# Patient Record
Sex: Male | Born: 1980 | Race: White | Hispanic: No | Marital: Married | Smoking: Never smoker
Health system: Southern US, Community
[De-identification: ages and names within clinical notes are randomized; demographics above are authoritative.]

## PROBLEM LIST (undated history)

## (undated) DIAGNOSIS — M549 Dorsalgia, unspecified: Secondary | ICD-10-CM

## (undated) HISTORY — PX: SHOULDER SURGERY: SHX246

---

## 2005-04-25 ENCOUNTER — Emergency Department: Payer: Self-pay | Admitting: Emergency Medicine

## 2006-05-12 ENCOUNTER — Emergency Department: Payer: Self-pay | Admitting: General Practice

## 2008-08-15 ENCOUNTER — Emergency Department: Payer: Self-pay | Admitting: Internal Medicine

## 2008-08-17 ENCOUNTER — Emergency Department: Payer: Self-pay | Admitting: Unknown Physician Specialty

## 2010-11-19 ENCOUNTER — Emergency Department: Payer: Self-pay | Admitting: Emergency Medicine

## 2011-10-24 ENCOUNTER — Emergency Department: Payer: Self-pay | Admitting: Emergency Medicine

## 2011-11-22 ENCOUNTER — Emergency Department: Payer: Self-pay | Admitting: Emergency Medicine

## 2013-04-22 ENCOUNTER — Emergency Department: Payer: Self-pay | Admitting: Emergency Medicine

## 2013-04-23 ENCOUNTER — Emergency Department: Payer: Self-pay | Admitting: Emergency Medicine

## 2014-01-03 ENCOUNTER — Emergency Department: Payer: Self-pay | Admitting: Emergency Medicine

## 2014-02-17 ENCOUNTER — Emergency Department: Payer: Self-pay | Admitting: Emergency Medicine

## 2014-04-09 ENCOUNTER — Emergency Department: Payer: Self-pay | Admitting: Emergency Medicine

## 2014-04-12 ENCOUNTER — Emergency Department: Payer: Self-pay | Admitting: Emergency Medicine

## 2014-04-22 ENCOUNTER — Emergency Department: Payer: Self-pay | Admitting: Emergency Medicine

## 2014-07-12 ENCOUNTER — Emergency Department: Payer: Self-pay | Admitting: Emergency Medicine

## 2015-01-04 ENCOUNTER — Encounter: Payer: Self-pay | Admitting: Intensive Care

## 2015-01-04 ENCOUNTER — Emergency Department
Admission: EM | Admit: 2015-01-04 | Discharge: 2015-01-04 | Disposition: A | Discharge status: Home / Self Care | Payer: Self-pay | Attending: Emergency Medicine | Admitting: Emergency Medicine

## 2015-01-04 DIAGNOSIS — K0889 Other specified disorders of teeth and supporting structures: Secondary | ICD-10-CM

## 2015-01-04 DIAGNOSIS — K088 Other specified disorders of teeth and supporting structures: Secondary | ICD-10-CM | POA: Insufficient documentation

## 2015-01-04 DIAGNOSIS — K029 Dental caries, unspecified: Secondary | ICD-10-CM | POA: Insufficient documentation

## 2015-01-04 MED ORDER — IBUPROFEN 800 MG PO TABS: 800.0000 mg | ORAL_TABLET | Freq: Three times a day (TID) | ORAL | Status: DC | PRN

## 2015-01-04 MED ORDER — PENICILLIN V POTASSIUM 250 MG PO TABS: 250.0000 mg | ORAL_TABLET | Freq: Four times a day (QID) | ORAL | Status: DC

## 2015-01-04 NOTE — ED Provider Notes (Signed)
[facility] Emergency Department Provider Note     Time seen: ----------------------------------------- 5:21 PM on 01/04/2015 -----------------------------------------    I have reviewed the triage vital signs and the nursing notes.   HISTORY  Chief Complaint Dental Pain    HPI Douglas Leonard is a 34 y.o. male who presents ER for left-sided tooth pain. Patient states he had injured the teeth posteriorly on the upper jaw several years ago. States she was brushing last night and seemed to exacerbate tooth pain on that side. He denies any fevers or other complaints.   History reviewed. No pertinent past medical history.  There are no active problems to display for this patient.   History reviewed. No pertinent past surgical history.  Allergies Oxycodone; Percocet; Valium; and Vicodin  Social History Social History  Substance Use Topics  . Smoking status: Never Smoker   . Smokeless tobacco: Never Used  . Alcohol Use: Yes     Comment: Occ    Review of Systems Constitutional: Negative for fever. ENT: Positive for toothache  ____________________________________________   PHYSICAL EXAM:  VITAL SIGNS: ED Triage Vitals  Enc Vitals Group     BP 01/04/15 1707 134/92 mmHg     Pulse Rate 01/04/15 1707 91     Resp 01/04/15 1707 18     Temp 01/04/15 1707 98.7 F (37.1 C)     Temp Source 01/04/15 1707 Oral     SpO2 01/04/15 1707 96 %     Weight 01/04/15 1707 199 lb (90.266 kg)     Height 01/04/15 1707 6' (1.829 m)     Head Cir --      Peak Flow --      Pain Score 01/04/15 1713 4     Pain Loc --      Pain Edu? --      Excl. in GC? --     Constitutional: Alert and oriented. Well appearing and in no distress. ENT   Head: Normocephalic and atraumatic.   Nose: No congestion/rhinnorhea.   Mouth/Throat: Generalized dental caries, no obvious acute dental fractures, no obvious acute dental abscess   Neck: No stridor.  ED  COURSE:  Pertinent labs & imaging results that were available during my care of the patient were reviewed by me and considered in my medical decision making (see chart for details). Patient with dental caries, will be prescribed penicillin and Motrin.  ____________________________________________  FINAL ASSESSMENT AND PLAN  Toothache  Plan: Patient with labs and imaging as dictated above. Patient discharged with Motrin and Pen-Vee K. He is stable for discharge   Emily Filbert, MD   Emily Filbert, MD 01/04/15 [phone]

## 2015-01-04 NOTE — ED Notes (Signed)
Patient c/o dental pain on R and L side

## 2015-01-04 NOTE — Discharge Instructions (Signed)

## 2015-01-05 ENCOUNTER — Encounter: Payer: Self-pay | Admitting: Emergency Medicine

## 2015-01-05 ENCOUNTER — Emergency Department
Admission: EM | Admit: 2015-01-05 | Discharge: 2015-01-05 | Disposition: A | Discharge status: Home / Self Care | Payer: Self-pay | Attending: Emergency Medicine | Admitting: Emergency Medicine

## 2015-01-05 DIAGNOSIS — K029 Dental caries, unspecified: Secondary | ICD-10-CM | POA: Insufficient documentation

## 2015-01-05 DIAGNOSIS — K088 Other specified disorders of teeth and supporting structures: Secondary | ICD-10-CM | POA: Insufficient documentation

## 2015-01-05 DIAGNOSIS — Z792 Long term (current) use of antibiotics: Secondary | ICD-10-CM | POA: Insufficient documentation

## 2015-01-05 DIAGNOSIS — A084 Viral intestinal infection, unspecified: Secondary | ICD-10-CM | POA: Insufficient documentation

## 2015-01-05 MED ORDER — DIPHENOXYLATE-ATROPINE 2.5-0.025 MG PO TABS
2.0000 | ORAL_TABLET | Freq: Once | ORAL | Status: AC
  Administered 2015-01-05: 2 via ORAL
  Filled 2015-01-05: qty 2

## 2015-01-05 MED ORDER — ONDANSETRON 8 MG PO TBDP
8.0000 mg | ORAL_TABLET | Freq: Once | ORAL | Status: AC
  Administered 2015-01-05: 8 mg via ORAL
  Filled 2015-01-05: qty 1

## 2015-01-05 MED ORDER — DIPHENOXYLATE-ATROPINE 2.5-0.025 MG PO TABS: 1.0000 | ORAL_TABLET | Freq: Four times a day (QID) | ORAL | Status: AC | PRN

## 2015-01-05 MED ORDER — ONDANSETRON 8 MG PO TBDP: 8.0000 mg | ORAL_TABLET | Freq: Three times a day (TID) | ORAL | Status: DC | PRN

## 2015-01-05 NOTE — ED Notes (Signed)
Pt was seen for  A toothache yesterday, given Penicillin, pt reports nausea, vomiting and diarrhea since starting medication

## 2015-01-05 NOTE — ED Notes (Signed)
Pt was seen yesterday and dx with dental abcsess, now having allergic reaction to pcn. Pt nauseated.

## 2015-01-05 NOTE — Discharge Instructions (Signed)
Follow up with [facility] today. OPTIONS FOR DENTAL FOLLOW UP CARE  [place] Department of Health and Human Services - Local Safety Net Dental Clinics [email or web address]   [facility] [phone])  Sharl Ma [phone])  [place] [phone])  [facility] [phone])  [facility] [phone]) This clinic caters to the indigent population and is on a lottery system. Location: [employer] [facility], [employer], [address] [place] Clinic Hours: Wednesdays from 6pm - 9pm, patients seen by a lottery system. For dates, call or go to [email or web address] Services: Cleanings, fillings and simple extractions. Payment Options: DENTAL WORK IS FREE OF CHARGE. Bring proof of income or support. Best way to get seen: Arrive at 5:15 pm - this is a lottery, NOT first come/first serve, so arriving earlier will not increase your chances of being seen.     [facility] [phone] Select option 1 for emergencies   Location: [facility], [place], [address] [place] Clinic Hours: No walk-ins accepted - call the day before to schedule an appointment. Check in times are 9:30 am and 1:30 pm. Services: Simple extractions, temporary fillings, pulpectomy/pulp debridement, uncomplicated abscess drainage. Payment Options: PAYMENT IS DUE AT THE TIME OF SERVICE.  Fee is usually $100-200, additional surgical procedures (e.g. abscess drainage) may be extra. Cash, checks, Visa/MasterCard accepted.  Can file Medicaid if patient is covered for dental - patient should call case worker to check. No discount for [facility] patients. Best way to get seen: MUST call the day before and get onto the schedule. Can usually be seen the next 1-2 days. No walk-ins accepted.     [facility]  Dental Services [phone]   Location: [facility], [address] [place] Clinic Hours: M, W, Th, F 8am or 1:30pm, Tues 9a or 1:30 - first come/first served. Services: Simple extractions, temporary fillings, uncomplicated abscess drainage.  You do not need to be an [facility] resident. Payment Options: PAYMENT IS DUE AT THE TIME OF SERVICE. Dental insurance, otherwise sliding scale - bring proof of income or support. Depending on income and treatment needed, cost is usually $50-200. Best way to get seen: Arrive early as it is first come/first served.     [facility] [phone]   Location: [address] Clinic Hours: Mon-Thu 8a-5p Services: Most basic dental services including extractions and fillings. Payment Options: PAYMENT IS DUE AT THE TIME OF SERVICE. Sliding scale, up to 50% off - bring proof if income or support. Medicaid with dental option accepted. Best way to get seen: Call to schedule an appointment, can usually be seen within 2 weeks OR they will try to see walk-ins - show up at 8a or 2p (you may have to wait).     [facility] [phone] [place]IDENTS ONLY   Location: [facility], [address] [place], [place] [address] Clinic Hours: By appointment only. Monday - Thursday 8am-5pm, Friday 8am-12pm Services: Cleanings, fillings, extractions. Payment Options: PAYMENT IS DUE AT THE TIME OF SERVICE. Cash, Visa or MasterCard. Sliding scale - $30 minimum per service. Best way to get seen: Come in to office, complete packet and make an appointment - need proof of income or support monies for each household member and proof of [facility] residence. Usually takes about a month to get in.     [facility]  Location: °[address] [place] °Clinic Hours: Walk-in Urgent Care Dental Services  are offered Monday-Friday mornings only. °The numbers of emergencies accepted daily is limited to the number of °providers available. °Maximum 15 - Mondays, Wednesdays & Thursdays °Maximum 10 - Tuesdays & Fridays °Services: °You do not need to be a [place] resident to be seen for a dental emergency. °Emergencies are defined as pain, swelling, abnormal bleeding, or dental trauma. Walkins will receive x-rays if needed. °NOTE: Dental cleaning is not an emergency. °Payment Options: °PAYMENT IS DUE AT THE TIME OF SERVICE. °Minimum co-pay is $40.00 for uninsured patients. °Minimum co-pay is $3.00 for Medicaid with dental coverage. °Dental Insurance is accepted and must be presented at time of visit. °Medicare does not cover dental. °Forms of payment: Cash, credit card, checks. °Best way to get seen: °If not previously registered with the clinic, walk-in dental registration begins at 7:15 am and is on a first come/first serve basis. °If previously registered with the clinic, call to make an appointment. °  °  °[facility] °[phone] °[place] RESIDENTS ONLY °  °Location: °[address], [place], [place] °Clinic Hours: °Mon-Thu 10a-2p °Services: Extractions only! °Payment Options: °FREE (donations accepted) - bring proof of income or support °Best way to get seen: °Call and schedule an appointment OR come at 8am on the 1st Monday of every month (except for holidays) when it is first come/first served. °  °  °[facility] °[phone] °  °Location: °[address], [place] °Clinic Hours: °Friday mornings °Services, Payment Options, Best way to get seen: °Call for info ° °

## 2015-01-05 NOTE — ED Provider Notes (Signed)
[facility] Emergency Department Provider Note  ____________________________________________  Time seen: Approximately 9:14 AM  I have reviewed the triage vital signs and the nursing notes.   HISTORY  Chief Complaint Allergic Reaction    HPI Yonah Tangeman is a 34 y.o. male patient complain of nausea vomiting diarrhea. Patient stated he started yesterday and started amoxicillin for dental abscess. Patient denies any rash or shortness of breath. Patient said his last vomiting episode was was in the waiting room. Patient said he has some very loose stools is unable to count just had loose stool prior to examination. No palliative measures with this complaint. Last dose of amoxicillin was last night.   History reviewed. No pertinent past medical history.  There are no active problems to display for this patient.   History reviewed. No pertinent past surgical history.  Current Outpatient Rx  Name  Route  Sig  Dispense  Refill  . diphenoxylate-atropine (LOMOTIL) 2.5-0.025 MG per tablet   Oral   Take 1 tablet by mouth 4 (four) times daily as needed for diarrhea or loose stools.   16 tablet   0   . ibuprofen (ADVIL,MOTRIN) 800 MG tablet   Oral   Take 1 tablet (800 mg total) by mouth every 8 (eight) hours as needed.   30 tablet   0   . ondansetron (ZOFRAN ODT) 8 MG disintegrating tablet   Oral   Take 1 tablet (8 mg total) by mouth every 8 (eight) hours as needed for nausea or vomiting.   20 tablet   0   . penicillin v potassium (VEETID) 250 MG tablet   Oral   Take 1 tablet (250 mg total) by mouth 4 (four) times daily.   40 tablet   0     Allergies Oxycodone; Percocet; Valium; and Vicodin  No family history on file.  Social History Social History  Substance Use Topics  . Smoking status: Never Smoker   . Smokeless tobacco: Never Used  . Alcohol Use: Yes     Comment: Occ    Review of Systems Constitutional: No fever/chills Eyes: No  visual changes. ENT: No sore throat. Cardiovascular: Denies chest pain. Respiratory: Denies shortness of breath. Gastrointestinal: No abdominal pain. Nausea vomiting diarrhea  No constipation. Genitourinary: Negative for dysuria. Musculoskeletal: Negative for back pain. Skin: Negative for rash. Neurological: Negative for headaches, focal weakness or numbness. 10-point ROS otherwise negative.  ____________________________________________   PHYSICAL EXAM:  VITAL SIGNS: ED Triage Vitals  Enc Vitals Group     BP 01/05/15 0756 141/86 mmHg     Pulse Rate 01/05/15 0756 79     Resp 01/05/15 0756 17     Temp 01/05/15 0756 98.9 F (37.2 C)     Temp Source 01/05/15 0756 Oral     SpO2 01/05/15 0756 98 %     Weight 01/05/15 0751 200 lb (90.719 kg)     Height 01/05/15 0751 6' (1.829 m)     Head Cir --      Peak Flow --      Pain Score --      Pain Loc --      Pain Edu? --      Excl. in GC? --     Constitutional: Alert and oriented. Well appearing and in no acute distress. Eyes: Conjunctivae are normal. PERRL. EOMI. Head: Atraumatic. Nose: No congestion/rhinnorhea. Mouth/Throat: Mucous membranes are moist.  Oropharynx erythematous deep vitalized tooth number 11 Neck: No stridor.   Cardiovascular: Normal  rate, regular rhythm. Grossly normal heart sounds.  Good peripheral circulation. Respiratory: Normal respiratory effort.  No retractions. Lungs CTAB. Gastrointestinal: Soft and nontender. No distention. Hyperactive bowel sounds No CVA tenderness. Musculoskeletal: No lower extremity tenderness nor edema.  No joint effusions. Neurologic:  Normal speech and language. No gross focal neurologic deficits are appreciated. No gait instability. Skin:  Skin is warm, dry and intact. No rash noted. Psychiatric: Mood and affect are normal. Speech and behavior are normal.  ____________________________________________   LABS (all labs ordered are listed, but only abnormal results are  displayed)  Labs Reviewed - No data to display ____________________________________________  EKG   ____________________________________________  RADIOLOGY   ____________________________________________   PROCEDURES  Procedure(s) performed: None  Critical Care performed: No  ____________________________________________   INITIAL IMPRESSION / ASSESSMENT AND PLAN / ED COURSE  Pertinent labs & imaging results that were available during my care of the patient were reviewed by me and considered in my medical decision making (see chart for details).  Viral gastroenteritis. Dental pain. Advised patient take medication as directed for nausea vomiting diarrhea. Advised patient of the walk-in dental clinic availability today. Patient discharged with prescription for Zofran and Lomotil. OPTIONS FOR DENTAL FOLLOW UP CARE  [place] Department of Health and Human Services - Local Safety Net Dental Clinics [email or web address]   [facility] [phone])  Sharl Ma [phone])  [place] [phone])  [facility] [phone])  [facility] [phone]) This clinic caters to the indigent population and is on a lottery system. Location: [employer] [facility], [employer], [address] [place] Clinic Hours: Wednesdays from 6pm - 9pm, patients seen by a lottery system. For dates, call or go to [email or web address] Services: Cleanings, fillings and simple extractions. Payment Options: DENTAL WORK IS FREE OF CHARGE. Bring proof of income or support. Best way to get seen: Arrive at 5:15 pm - this is a lottery, NOT first come/first serve, so arriving earlier will not increase your chances of being seen.     [facility] [phone] Select option 1 for emergencies   Location: [facility],  [place], [address] [place] Clinic Hours: No walk-ins accepted - call the day before to schedule an appointment. Check in times are 9:30 am and 1:30 pm. Services: Simple extractions, temporary fillings, pulpectomy/pulp debridement, uncomplicated abscess drainage. Payment Options: PAYMENT IS DUE AT THE TIME OF SERVICE.  Fee is usually $100-200, additional surgical procedures (e.g. abscess drainage) may be extra. Cash, checks, Visa/MasterCard accepted.  Can file Medicaid if patient is covered for dental - patient should call case worker to check. No discount for [facility] patients. Best way to get seen: MUST call the day before and get onto the schedule. Can usually be seen the next 1-2 days. No walk-ins accepted.     [facility] [phone]   Location: [facility], [address] [place] Clinic Hours: M, W, Th, F 8am or 1:30pm, Tues 9a or 1:30 - first come/first served. Services: Simple extractions, temporary fillings, uncomplicated abscess drainage.  You do not need to be an [facility] resident. Payment Options: PAYMENT IS DUE AT THE TIME OF SERVICE. Dental insurance, otherwise sliding scale - bring proof of income or support. Depending on income and treatment needed, cost is usually $50-200. Best way to get seen: Arrive early as it is first come/first served.     [facility]  Clinic [phone]   Location: [address] Clinic Hours: Mon-Thu 8a-5p Services: Most basic dental services including extractions and fillings. Payment Options: PAYMENT IS DUE AT THE TIME OF SERVICE. Sliding scale, up to 50% off - bring proof if income or support. Medicaid with dental option accepted. Best way to get seen: Call to schedule an appointment, can usually be seen within 2 weeks OR they will try to see walk-ins - show up at 8a or 2p (you may have to wait).     [facility]  Clinic [phone] [place]IDENTS ONLY   Location: [facility], [address], [place], [place] [address] Clinic Hours: By appointment only. Monday - Thursday 8am-5pm, Friday 8am-12pm Services: Cleanings, fillings, extractions. Payment Options: PAYMENT IS DUE AT THE TIME OF SERVICE. Cash, Visa or MasterCard. Sliding scale - $30 minimum per service. Best way to get seen: Come in to office, complete packet and make an appointment - need proof of income or support monies for each household member and proof of [facility] residence. Usually takes about a month to get in.     [facility] [phone]   Location: [address] [facility] Clinic Hours: Walk-in Urgent Care Dental Services are offered Monday-Friday mornings only. The numbers of emergencies accepted daily is limited to the number of providers available. Maximum 15 - Mondays, Wednesdays & Thursdays Maximum 10 - Tuesdays & Fridays Services: You do not need to be a [facility] resident to be seen for a dental emergency. Emergencies are defined as pain, swelling, abnormal bleeding, or dental trauma. Walkins will receive x-rays if needed. NOTE: Dental cleaning is not an emergency. Payment Options: PAYMENT IS DUE AT THE TIME OF SERVICE. Minimum co-pay is $40.00 for uninsured patients. Minimum co-pay is $3.00 for Medicaid with dental coverage. Dental Insurance is accepted and must be presented at time of visit. Medicare does not cover dental. Forms of payment: Cash, credit card, checks. Best way to get seen: If not previously registered with the clinic, walk-in dental registration begins at 7:15 am and is on a first come/first serve basis. If previously registered with the clinic, call to make an appointment.     [facility] [phone] [place] RESIDENTS ONLY   Location: [address] [place], [place] Clinic Hours: Mon-Thu  10a-2p Services: Extractions only! Payment Options: FREE (donations accepted) - bring proof of income or support Best way to get seen: Call and schedule an appointment OR come at 8am on the 1st Monday of every month (except for holidays) when it is first come/first served.     [facility] [phone]   Location: [address] [place], [place] Clinic Hours: Friday mornings Services, Payment Options, Best way to get seen: Call for info  ____________________________________________   FINAL CLINICAL IMPRESSION(S) / ED DIAGNOSES  Final diagnoses:  Viral gastroenteritis  Pain due to dental caries      Joni Reining, PA-C 01/05/15 0922  Joni Reining, PA-C 01/05/15 0923  Joni Reining, PA-C 01/05/15 3664  Arnaldo Natal, MD 01/05/15 [phone]

## 2015-01-17 ENCOUNTER — Encounter: Payer: Self-pay | Admitting: Emergency Medicine

## 2015-01-17 ENCOUNTER — Emergency Department
Admission: EM | Admit: 2015-01-17 | Discharge: 2015-01-17 | Disposition: A | Discharge status: Home / Self Care | Payer: Self-pay | Attending: Emergency Medicine | Admitting: Emergency Medicine

## 2015-01-17 DIAGNOSIS — Z792 Long term (current) use of antibiotics: Secondary | ICD-10-CM | POA: Insufficient documentation

## 2015-01-17 DIAGNOSIS — Y9289 Other specified places as the place of occurrence of the external cause: Secondary | ICD-10-CM | POA: Insufficient documentation

## 2015-01-17 DIAGNOSIS — IMO0002 Reserved for concepts with insufficient information to code with codable children: Secondary | ICD-10-CM

## 2015-01-17 DIAGNOSIS — Y998 Other external cause status: Secondary | ICD-10-CM | POA: Insufficient documentation

## 2015-01-17 DIAGNOSIS — S61411A Laceration without foreign body of right hand, initial encounter: Secondary | ICD-10-CM | POA: Insufficient documentation

## 2015-01-17 DIAGNOSIS — Y288XXA Contact with other sharp object, undetermined intent, initial encounter: Secondary | ICD-10-CM | POA: Insufficient documentation

## 2015-01-17 DIAGNOSIS — Y9389 Activity, other specified: Secondary | ICD-10-CM | POA: Insufficient documentation

## 2015-01-17 DIAGNOSIS — Z23 Encounter for immunization: Secondary | ICD-10-CM | POA: Insufficient documentation

## 2015-01-17 MED ORDER — TETANUS-DIPHTH-ACELL PERTUSSIS 5-2.5-18.5 LF-MCG/0.5 IM SUSP
0.5000 mL | Freq: Once | INTRAMUSCULAR | Status: AC
  Administered 2015-01-17: 0.5 mL via INTRAMUSCULAR
  Filled 2015-01-17: qty 0.5

## 2015-01-17 MED ORDER — LIDOCAINE HCL (PF) 1 % IJ SOLN
5.0000 mL | Freq: Once | INTRAMUSCULAR | Status: AC
Start: 2015-01-17 — End: 2015-01-17
  Administered 2015-01-17: 5 mL via INTRADERMAL
  Filled 2015-01-17: qty 5

## 2015-01-17 NOTE — ED Notes (Signed)
Laceration to right hand while changing tire. Bleeding controled at this time.

## 2015-01-17 NOTE — ED Notes (Signed)
Laceration to right hand °

## 2015-01-17 NOTE — ED Provider Notes (Signed)
[facility] Emergency Department Provider Note ____________________________________________  Time seen: Approximately 11:01 AM  I have reviewed the triage vital signs and the nursing notes.   HISTORY  Chief Complaint Laceration  HPI Douglas Leonard is a 34 y.o. male who presents to the emergency department for evaluation of a laceration to his right hand. He was changing a tire and cut it on the rim. He is unsure of the last time he had a Tdap.   History reviewed. No pertinent past medical history.  There are no active problems to display for this patient.   History reviewed. No pertinent past surgical history.  Current Outpatient Rx  Name  Route  Sig  Dispense  Refill  . diphenoxylate-atropine (LOMOTIL) 2.5-0.025 MG per tablet   Oral   Take 1 tablet by mouth 4 (four) times daily as needed for diarrhea or loose stools.   16 tablet   0   . ibuprofen (ADVIL,MOTRIN) 800 MG tablet   Oral   Take 1 tablet (800 mg total) by mouth every 8 (eight) hours as needed.   30 tablet   0   . ondansetron (ZOFRAN ODT) 8 MG disintegrating tablet   Oral   Take 1 tablet (8 mg total) by mouth every 8 (eight) hours as needed for nausea or vomiting.   20 tablet   0   . penicillin v potassium (VEETID) 250 MG tablet   Oral   Take 1 tablet (250 mg total) by mouth 4 (four) times daily.   40 tablet   0     Allergies Oxycodone; Percocet; Valium; and Vicodin  No family history on file.  Social History Social History  Substance Use Topics  . Smoking status: Never Smoker   . Smokeless tobacco: Never Used  . Alcohol Use: Yes     Comment: Occ    Review of Systems   Constitutional: No fever/chills Eyes: No visual changes. ENT: No congestion or rhinorrhea Cardiovascular: Denies chest pain. Respiratory: Denies shortness of breath. Gastrointestinal: No abdominal pain.  No nausea, no vomiting.  No diarrhea.  No constipation. Genitourinary: Negative for  dysuria. Musculoskeletal: Negative for back pain. Skin: Positive for laceration Neurological: Negative for headaches, focal weakness or numbness.  10-point ROS otherwise negative.  ____________________________________________   PHYSICAL EXAM:  VITAL SIGNS: ED Triage Vitals  Enc Vitals Group     BP 01/17/15 1009 140/80 mmHg     Pulse Rate 01/17/15 1009 80     Resp 01/17/15 1009 20     Temp 01/17/15 1009 98 F (36.7 C)     Temp Source 01/17/15 1009 Oral     SpO2 01/17/15 1009 98 %     Weight 01/17/15 1009 195 lb (88.451 kg)     Height 01/17/15 1009 6' (1.829 m)     Head Cir --      Peak Flow --      Pain Score 01/17/15 1010 0     Pain Loc --      Pain Edu? --      Excl. in GC? --     Constitutional: Alert and oriented. Well appearing and in no acute distress. Eyes: Conjunctivae are normal. PERRL. EOMI. Head: Atraumatic. Nose: No congestion/rhinnorhea. Mouth/Throat: Mucous membranes are moist.  Oropharynx non-erythematous. No oral lesions. Neck: No stridor. Cardiovascular: Normal rate, regular rhythm.  Good peripheral circulation. Respiratory: Normal respiratory effort.  No retractions. Lungs CTAB. Gastrointestinal: Soft and nontender. No distention. No abdominal bruits.  Musculoskeletal: No lower extremity  tenderness nor edema.  No joint effusions. Full range of motion of fingers and wrist on the right hand Neurologic:  Normal speech and language. No gross focal neurologic deficits are appreciated. Speech is normal. No gait instability. Skin:  1.5 cm laceration noted to the dorsal aspect of the right hand just above the webspace between the third and fourth digits; Negative for petechiae.  Psychiatric: Mood and affect are normal. Speech and behavior are normal.  ____________________________________________   LABS (all labs ordered are listed, but only abnormal results are displayed)  Labs Reviewed - No data to  display ____________________________________________  EKG   ____________________________________________  RADIOLOGY  Not indicated ____________________________________________   PROCEDURES  Procedure(s) performed: None ____________________________________________   INITIAL IMPRESSION / ASSESSMENT AND PLAN / ED COURSE  Pertinent labs & imaging results that were available during my care of the patient were reviewed by me and considered in my medical decision making (see chart for details).  Patient was advised to return in 10 days for suture removal. He was advised to return sooner for symptoms of concern. ____________________________________________   FINAL CLINICAL IMPRESSION(S) / ED DIAGNOSES  Final diagnoses:  Laceration       Chinita Pester, FNP 01/17/15 1343  Sharman Cheek, MD 01/17/15 [phone]

## 2016-02-18 ENCOUNTER — Encounter: Payer: Self-pay | Admitting: Urgent Care

## 2016-02-18 ENCOUNTER — Emergency Department
Admission: EM | Admit: 2016-02-18 | Discharge: 2016-02-18 | Disposition: A | Discharge status: Home / Self Care | Payer: Self-pay | Attending: Emergency Medicine | Admitting: Emergency Medicine

## 2016-02-18 DIAGNOSIS — H6691 Otitis media, unspecified, right ear: Secondary | ICD-10-CM | POA: Insufficient documentation

## 2016-02-18 DIAGNOSIS — H6690 Otitis media, unspecified, unspecified ear: Secondary | ICD-10-CM

## 2016-02-18 DIAGNOSIS — Z79899 Other long term (current) drug therapy: Secondary | ICD-10-CM | POA: Insufficient documentation

## 2016-02-18 DIAGNOSIS — H60501 Unspecified acute noninfective otitis externa, right ear: Secondary | ICD-10-CM | POA: Insufficient documentation

## 2016-02-18 MED ORDER — AMOXICILLIN-POT CLAVULANATE 875-125 MG PO TABS: 1.0000 | ORAL_TABLET | Freq: Two times a day (BID) | ORAL | 0 refills | Status: AC

## 2016-02-18 MED ORDER — CIPROFLOXACIN-DEXAMETHASONE 0.3-0.1 % OT SUSP
4.0000 [drp] | Freq: Once | OTIC | Status: AC
  Administered 2016-02-18: 4 [drp] via OTIC
  Filled 2016-02-18: qty 7.5

## 2016-02-18 MED ORDER — AMOXICILLIN-POT CLAVULANATE 875-125 MG PO TABS
1.0000 | ORAL_TABLET | Freq: Once | ORAL | Status: AC
  Administered 2016-02-18: 1 via ORAL
  Filled 2016-02-18: qty 1

## 2016-02-18 MED ORDER — CIPROFLOXACIN-DEXAMETHASONE 0.3-0.1 % OT SUSP: 4.0000 [drp] | Freq: Two times a day (BID) | OTIC | 0 refills | Status: AC

## 2016-02-18 NOTE — ED Provider Notes (Signed)
[facility] Emergency Department Provider Note    First MD Initiated Contact with Patient 02/18/16 0430     (approximate)  I have reviewed the triage vital signs and the nursing notes.   HISTORY  Chief Complaint Ear Fullness  HPI Shaune SpittleMichael Rye is a 35 y.o. male presents with acute onset of right ear pain and drainage 1 day. Patient states that he has decreased hearing from that ear stating that is not completely deaf however sounds are muffled as if he were "under water". Patient also states that he hears a swishing sound in his ear. Patient denies any fever.   Past medical history None There are no active problems to display for this patient.   Past surgical history None  Prior to Admission medications   Medication Sig Start Date End Date Taking? Authorizing Provider  ibuprofen (ADVIL,MOTRIN) 800 MG tablet Take 1 tablet (800 mg total) by mouth every 8 (eight) hours as needed. 01/04/15   Emily FilbertJonathan E Williams, MD  ondansetron (ZOFRAN ODT) 8 MG disintegrating tablet Take 1 tablet (8 mg total) by mouth every 8 (eight) hours as needed for nausea or vomiting. 01/05/15   Joni Reiningonald K Smith, PA-C  penicillin v potassium (VEETID) 250 MG tablet Take 1 tablet (250 mg total) by mouth 4 (four) times daily. 01/04/15   Emily FilbertJonathan E Williams, MD    Allergies Oxycodone; Percocet [oxycodone-acetaminophen]; Valium [diazepam]; and Vicodin [hydrocodone-acetaminophen]  No family history on file.  Social History Social History  Substance Use Topics  . Smoking status: Never Smoker  . Smokeless tobacco: Never Used  . Alcohol use No     Comment: Occ    Review of Systems Constitutional: No fever/chills Eyes: No visual changes. ENT: No sore throat.Positive for right ear pain Cardiovascular: Denies chest pain. Respiratory: Denies shortness of breath. Gastrointestinal: No abdominal pain.  No nausea, no vomiting.  No diarrhea.  No constipation. Genitourinary: Negative for  dysuria. Musculoskeletal: Negative for back pain. Skin: Negative for rash. Neurological: Negative for headaches, focal weakness or numbness.  10-point ROS otherwise negative.  ____________________________________________   PHYSICAL EXAM:  VITAL SIGNS: ED Triage Vitals [02/18/16 0225]  Enc Vitals Group     BP 132/87     Pulse Rate 84     Resp 16     Temp 98.8 F (37.1 C)     Temp Source Oral     SpO2 98 %     Weight 205 lb (93 kg)     Height 6' (1.829 m)     Head Circumference      Peak Flow      Pain Score 0     Pain Loc      Pain Edu?      Excl. in GC?     Constitutional: Alert and oriented. Well appearing and in no acute distress. Eyes: Conjunctivae are normal. PERRL. EOMI. Head: Atraumatic. Ears:  Purulent-appearing right EAC exudate noted Nose: No congestion/rhinnorhea. Mouth/Throat: Mucous membranes are moist.  Oropharynx non-erythematous. Neck: Right anterior cervical lymphadenopathy noted  Cardiovascular: Normal rate, regular rhythm. Good peripheral circulation. Grossly normal heart sounds. Respiratory: Normal respiratory effort.  No retractions. Lungs CTAB. Gastrointestinal: Soft and nontender. No distention.  Musculoskeletal: No lower extremity tenderness nor edema. No gross deformities of extremities. Neurologic:  Normal speech and language. No gross focal neurologic deficits are appreciated.  Skin:  Skin is warm, dry and intact. No rash noted. Psychiatric: Mood and affect are normal. Speech and behavior are normal.  Procedures    INITIAL IMPRESSION / ASSESSMENT AND PLAN / ED COURSE  Pertinent labs & imaging results that were available during my care of the patient were reviewed by me and considered in my medical decision making (see chart for details).  Ciprodex given, Augmentin given  Clinical Course    ____________________________________________  FINAL CLINICAL IMPRESSION(S) / ED DIAGNOSES  Final diagnoses:  Acute otitis externa of  right ear, unspecified type  Acute otitis media, unspecified otitis media type     MEDICATIONS GIVEN DURING THIS VISIT:  Medications  ciprofloxacin-dexamethasone (CIPRODEX) 0.3-0.1 % otic suspension 4 drop (not administered)  amoxicillin-clavulanate (AUGMENTIN) 875-125 MG per tablet 1 tablet (not administered)     NEW OUTPATIENT MEDICATIONS STARTED DURING THIS VISIT:  New Prescriptions   No medications on file    Modified Medications   No medications on file    Discontinued Medications   No medications on file     Note:  This document was prepared using Dragon voice recognition software and may include unintentional dictation errors.    Darci Current, MD 02/20/16 [phone]

## 2016-02-18 NOTE — ED Triage Notes (Signed)
Patient presents to the ED tonight with c/o decreased ability to hear out of his RIGHT ear. Symptoms started yesterday. Patient states, "I am not completely deaf, but it sounds like there is water in there and when I move it, I can hear it squishing". Patient denies recent illnesses, otorrhea, and otalgia. NOS reported at this time.

## 2016-08-15 ENCOUNTER — Encounter: Payer: Self-pay | Admitting: Emergency Medicine

## 2016-08-15 ENCOUNTER — Emergency Department
Admission: EM | Admit: 2016-08-15 | Discharge: 2016-08-15 | Disposition: A | Discharge status: Home / Self Care | Payer: 59 | Attending: Emergency Medicine | Admitting: Emergency Medicine

## 2016-08-15 DIAGNOSIS — J019 Acute sinusitis, unspecified: Secondary | ICD-10-CM

## 2016-08-15 MED ORDER — IBUPROFEN 600 MG PO TABS
600.0000 mg | ORAL_TABLET | Freq: Once | ORAL | Status: AC
  Administered 2016-08-15: 600 mg via ORAL
  Filled 2016-08-15: qty 1

## 2016-08-15 MED ORDER — FLUTICASONE PROPIONATE 50 MCG/ACT NA SUSP: 1.0000 | Freq: Every day | NASAL | 0 refills | Status: DC

## 2016-08-15 MED ORDER — PSEUDOEPHEDRINE HCL 30 MG PO TABS: 30.0000 mg | ORAL_TABLET | Freq: Four times a day (QID) | ORAL | 0 refills | Status: AC | PRN

## 2016-08-15 NOTE — ED Provider Notes (Signed)
[facility] Emergency Department Provider Note   ____________________________________________   First MD Initiated Contact with Patient 08/15/16 [phone]     (approximate)  I have reviewed the triage vital signs and the nursing notes.   HISTORY  Chief Complaint Facial Pain; Nasal Congestion; and Cough    HPI Douglas Leonard is a 36 y.o. male who comes into the hospital today with some sinus problems. He reports he can't get his sinuses to say clear. The patient reports that he has been taking oxymetolazone and it has not helped.The patient reports that he took it before bedtime and woke up with phlegm and severe congestion. He is also been taking Mucinex sinus. The patient's symptoms started on Saturday morning approximately 3 days ago. He reports that he thought it was due to the weather but he did have some fevers and chills at night. The patient states his MAXIMUM TEMPERATURE was 101. The patient has had worsening symptoms and some mild improvement. He reports that he's been unable to sleep for 3 nights because he cannot breathe very well. The patient reports that he had a cough with some yellow phlegm and white streaks which concerned him. He is here today for evaluation.   History reviewed. No pertinent past medical history.  There are no active problems to display for this patient.   History reviewed. No pertinent surgical history.  Prior to Admission medications   Medication Sig Start Date End Date Taking? Authorizing Provider  fluticasone (FLONASE) 50 MCG/ACT nasal spray Place 1 spray into both nostrils daily. 08/15/16 08/15/17  Rebecka Apley, MD  ibuprofen (ADVIL,MOTRIN) 800 MG tablet Take 1 tablet (800 mg total) by mouth every 8 (eight) hours as needed. 01/04/15   Emily Filbert, MD  ondansetron (ZOFRAN ODT) 8 MG disintegrating tablet Take 1 tablet (8 mg total) by mouth every 8 (eight) hours as needed for nausea or vomiting. 01/05/15   Joni Reining, PA-C  penicillin v potassium (VEETID) 250 MG tablet Take 1 tablet (250 mg total) by mouth 4 (four) times daily. 01/04/15   Emily Filbert, MD  pseudoephedrine (SUDAFED) 30 MG tablet Take 1 tablet (30 mg total) by mouth every 6 (six) hours as needed for congestion. 08/15/16 08/15/17  Rebecka Apley, MD    Allergies Oxycodone; Percocet [oxycodone-acetaminophen]; Valium [diazepam]; and Vicodin [hydrocodone-acetaminophen]  History reviewed. No pertinent family history.  Social History Social History  Substance Use Topics  . Smoking status: Never Smoker  . Smokeless tobacco: Never Used  . Alcohol use No     Comment: Occ    Review of Systems  Constitutional: No fever/chills Eyes: No visual changes. ENT: Facial tenderness to palpation and nasal congestion Cardiovascular: Denies chest pain. Respiratory: Cough and shortness of breath. Gastrointestinal: No abdominal pain.  No nausea, no vomiting.  No diarrhea.  No constipation. Genitourinary: Negative for dysuria. Musculoskeletal: Negative for back pain. Skin: Negative for rash. Neurological: Negative for headaches, focal weakness or numbness.   ____________________________________________   PHYSICAL EXAM:  VITAL SIGNS: ED Triage Vitals  Enc Vitals Group     BP 08/15/16 0434 124/84     Pulse Rate 08/15/16 0434 99     Resp 08/15/16 0434 16     Temp 08/15/16 0434 99.5 F (37.5 C)     Temp Source 08/15/16 0434 Oral     SpO2 08/15/16 0434 99 %     Weight 08/15/16 0432 205 lb (93 kg)     Height --  Head Circumference --      Peak Flow --      Pain Score --      Pain Loc --      Pain Edu? --      Excl. in GC? --     Constitutional: Alert and oriented. Well appearing and in mild distress. Eyes: Conjunctivae are normal. PERRL. EOMI. Head: Atraumatic. Nose: No congestion/rhinnorhea. Maxillary and sphenoid sinus tenderness to palpation Mouth/Throat: Mucous membranes are moist.  Oropharynx  non-erythematous. Cardiovascular: Normal rate, regular rhythm. Grossly normal heart sounds.  Good peripheral circulation. Respiratory: Normal respiratory effort.  No retractions. Lungs CTAB. Gastrointestinal: Soft and nontender. No distention. Musculoskeletal: No lower extremity tenderness nor edema.  Neurologic:  Normal speech and language.  Skin:  Skin is warm, dry and intact Psychiatric: Mood and affect are normal.   ____________________________________________   LABS (all labs ordered are listed, but only abnormal results are displayed)  Labs Reviewed - No data to display ____________________________________________  EKG  none ____________________________________________  RADIOLOGY  none ____________________________________________   PROCEDURES  Procedure(s) performed: None  Procedures  Critical Care performed: No  ____________________________________________   INITIAL IMPRESSION / ASSESSMENT AND PLAN / ED COURSE  Pertinent labs & imaging results that were available during my care of the patient were reviewed by me and considered in my medical decision making (see chart for details).  This is a 36 year old male who comes into the hospital today with some sinus pain and pressure as well as some nasal congestion and cough. The patient reports that he is concerned but I think he may have some sinusitis. The patient is only had symptoms for 3 days. Typical sinusitis will clear on its own within about 10 days. I will treat the patient with some ibuprofen currently as well as some Flonase and Sudafed. I will discharge the patient to have him follow-up with the acute care clinic. The patient has no further complaints or concerns at this time. I will not give the patient amoxicillin at this time.      ____________________________________________   FINAL CLINICAL IMPRESSION(S) / ED DIAGNOSES  Final diagnoses:  Acute non-recurrent sinusitis, unspecified location       NEW MEDICATIONS STARTED DURING THIS VISIT:  Discharge Medication List as of 08/15/2016  5:56 AM    START taking these medications   Details  fluticasone (FLONASE) 50 MCG/ACT nasal spray Place 1 spray into both nostrils daily., Starting Tue 08/15/2016, Until Wed 08/15/2017, Print    pseudoephedrine (SUDAFED) 30 MG tablet Take 1 tablet (30 mg total) by mouth every 6 (six) hours as needed for congestion., Starting Tue 08/15/2016, Until Wed 08/15/2017, Print         Note:  This document was prepared using Dragon voice recognition software and may include unintentional dictation errors.    Rebecka Apley, MD 08/15/16 [phone]

## 2016-08-15 NOTE — ED Triage Notes (Addendum)
Pt ambulatory to triage with steady gait, no distress noted. Pt c/o of sinus pressure, nasal congestion, cough x3 days, unrelieved by musinex and OTC nasal spray. Pts O2 in triage at 99% RA, pt A&O x4. Pt denies SOB.

## 2016-08-15 NOTE — ED Notes (Signed)

## 2016-08-15 NOTE — ED Notes (Signed)
Pt c/o nasal congestion that woke him up at around 0300; pt states using a nasal decongestant around 2300 yesterday and that worked to relieve congestion but it "stopped working at 3"; at this time pt is on the stretcher, on his phone; pt does not appear to be in distress at this time; pt's VS are WDL; pt is alert and oriented x4 and able to speak in complete sentences.

## 2016-09-26 ENCOUNTER — Encounter: Payer: Self-pay | Admitting: Emergency Medicine

## 2016-09-26 ENCOUNTER — Emergency Department
Admission: EM | Admit: 2016-09-26 | Discharge: 2016-09-26 | Disposition: A | Discharge status: Home / Self Care | Payer: 59 | Attending: Emergency Medicine | Admitting: Emergency Medicine

## 2016-09-26 DIAGNOSIS — H6991 Unspecified Eustachian tube disorder, right ear: Secondary | ICD-10-CM

## 2016-09-26 DIAGNOSIS — J0110 Acute frontal sinusitis, unspecified: Secondary | ICD-10-CM | POA: Insufficient documentation

## 2016-09-26 DIAGNOSIS — H6121 Impacted cerumen, right ear: Secondary | ICD-10-CM | POA: Insufficient documentation

## 2016-09-26 DIAGNOSIS — H6981 Other specified disorders of Eustachian tube, right ear: Secondary | ICD-10-CM | POA: Insufficient documentation

## 2016-09-26 MED ORDER — PREDNISONE 10 MG (21) PO TBPK: ORAL_TABLET | Freq: Every day | ORAL | 0 refills | Status: AC

## 2016-09-26 MED ORDER — AMOXICILLIN-POT CLAVULANATE 875-125 MG PO TABS: 1.0000 | ORAL_TABLET | Freq: Two times a day (BID) | ORAL | 0 refills | Status: AC

## 2016-09-26 NOTE — ED Triage Notes (Signed)
States he has had problems with sinus pressure and ear fullness for several weeks  The sx's became better and now has right ear fullness  This started about 1 1/2weeks ago

## 2016-09-26 NOTE — ED Provider Notes (Signed)
[facility] Emergency Department Provider Note  ____________________________________________  Time seen: Approximately 8:13 PM  I have reviewed the triage vital signs and the nursing notes.   HISTORY  Chief Complaint Otalgia    HPI Douglas Leonard is a 36 y.o. male presenting to the emergency department with right otalgia, diminished hearing on the right, and maxillary sinus tenderness for approximately 2 weeks. Patient states that he presented to his primary care provider, who recommended Flonase. Patient has been afebrile. He has noticed no discharge from the right ear. He denies a history of middle ear infections. He has been afebrile. He denies associated chest pain, chest tightness, nausea, vomiting or abdominal pain. No other alleviating measures have been undertaken   History reviewed. No pertinent past medical history.  There are no active problems to display for this patient.   History reviewed. No pertinent surgical history.  Prior to Admission medications   Medication Sig Start Date End Date Taking? Authorizing Provider  amoxicillin-clavulanate (AUGMENTIN) 875-125 MG tablet Take 1 tablet by mouth 2 (two) times daily. 09/26/16 10/06/16  Orvil FeilWoods, Wynne Jury M, PA-C  fluticasone (FLONASE) 50 MCG/ACT nasal spray Place 1 spray into both nostrils daily. 08/15/16 08/15/17  Rebecka ApleyWebster, Allison P, MD  ibuprofen (ADVIL,MOTRIN) 800 MG tablet Take 1 tablet (800 mg total) by mouth every 8 (eight) hours as needed. 01/04/15   Emily FilbertWilliams, Jonathan E, MD  ondansetron (ZOFRAN ODT) 8 MG disintegrating tablet Take 1 tablet (8 mg total) by mouth every 8 (eight) hours as needed for nausea or vomiting. 01/05/15   Joni ReiningSmith, Ronald K, PA-C  penicillin v potassium (VEETID) 250 MG tablet Take 1 tablet (250 mg total) by mouth 4 (four) times daily. 01/04/15   Emily FilbertWilliams, Jonathan E, MD  predniSONE (STERAPRED UNI-PAK 21 TAB) 10 MG (21) TBPK tablet Take by mouth daily. Take 6 tablets the first day, take 5  tablets the second day, take 4 tablets the third day, take 3 tablets the fourth day, take 2 tablets the fifth day, take 1 tablet the sixth day. 09/26/16 10/02/16  Orvil FeilWoods, Tyger Oka M, PA-C  pseudoephedrine (SUDAFED) 30 MG tablet Take 1 tablet (30 mg total) by mouth every 6 (six) hours as needed for congestion. 08/15/16 08/15/17  Rebecka ApleyWebster, Allison P, MD    Allergies Oxycodone; Percocet [oxycodone-acetaminophen]; Valium [diazepam]; and Vicodin [hydrocodone-acetaminophen]  No family history on file.  Social History Social History  Substance Use Topics  . Smoking status: Never Smoker  . Smokeless tobacco: Never Used  . Alcohol use No     Comment: Occ     Review of Systems  Constitutional:Patient has maxillary sinus tenderness. Eyes: No visual changes. No discharge ENT: Patient has diminished hearing on the right. Cardiovascular: no chest pain. Respiratory: no cough. No SOB. Musculoskeletal: Negative for musculoskeletal pain. Skin: Negative for rash, abrasions, lacerations, ecchymosis. Neurological: Negative for headaches, focal weakness or numbness.   ____________________________________________   PHYSICAL EXAM:  VITAL SIGNS: ED Triage Vitals  Enc Vitals Group     BP 09/26/16 1804 121/73     Pulse Rate 09/26/16 1804 80     Resp 09/26/16 1804 18     Temp 09/26/16 1804 97.9 F (36.6 C)     Temp Source 09/26/16 1804 Oral     SpO2 09/26/16 1804 99 %     Weight 09/26/16 1814 205 lb (93 kg)     Height 09/26/16 1814 6' (1.829 m)     Head Circumference --      Peak Flow --  Pain Score 09/26/16 1927 0     Pain Loc --      Pain Edu? --      Excl. in GC? --      Constitutional: Alert and oriented. Well appearing and in no acute distress. Eyes: Conjunctivae are normal. PERRL. EOMI. Head: Atraumatic.Patient has maxillary sinus tenderness. ENT:      Ears: Patient's right tympanic membrane cannot be entirely visualized due to impacted cerumen. Left tympanic membrane appears  scarred.      Nose: No congestion/rhinnorhea.      Mouth/Throat: Mucous membranes are moist.  Neck: Full range of motion Hematological/Lymphatic/Immunilogical: No cervical lymphadenopathy. Cardiovascular: Normal rate, regular rhythm. Normal S1 and S2.  Good peripheral circulation. Respiratory: Normal respiratory effort without tachypnea or retractions. Lungs CTAB. Good air entry to the bases with no decreased or absent breath sounds. Skin:  Skin is warm, dry and intact. No rash noted. Psychiatric: Mood and affect are normal. Speech and behavior are normal. Patient exhibits appropriate insight and judgement.   ____________________________________________   LABS (all labs ordered are listed, but only abnormal results are displayed)  Labs Reviewed - No data to display ____________________________________________  EKG   ____________________________________________  RADIOLOGY   No results found.  ____________________________________________    PROCEDURES  Procedure(s) performed:    Procedures    Medications - No data to display   ____________________________________________   INITIAL IMPRESSION / ASSESSMENT AND PLAN / ED COURSE  Pertinent labs & imaging results that were available during my care of the patient were reviewed by me and considered in my medical decision making (see chart for details).  Review of the [place] CSRS was performed in accordance of the [facility] prior to dispensing any controlled drugs.     Assessment and plan Eustachian tube dysfunction Sinusitis Patient presents to the emergency department with diminished hearing and maxillary sinus tenderness for the past 2 weeks. History and physical exam findings are consistent with sinusitis and eustachian tube dysfunction. Patient was discharged with Augmentin and tapered prednisone. Patient was advised to follow-up with primary care in 1 week. Vital signs are reassuring prior to discharge. All patient  questions were answered.    ____________________________________________  FINAL CLINICAL IMPRESSION(S) / ED DIAGNOSES  Final diagnoses:  Acute non-recurrent frontal sinusitis  Dysfunction of right eustachian tube      NEW MEDICATIONS STARTED DURING THIS VISIT:  Discharge Medication List as of 09/26/2016  7:24 PM    START taking these medications   Details  amoxicillin-clavulanate (AUGMENTIN) 875-125 MG tablet Take 1 tablet by mouth 2 (two) times daily., Starting Tue 09/26/2016, Until Fri 10/06/2016, Print    predniSONE (STERAPRED UNI-PAK 21 TAB) 10 MG (21) TBPK tablet Take by mouth daily. Take 6 tablets the first day, take 5 tablets the second day, take 4 tablets the third day, take 3 tablets the fourth day, take 2 tablets the fifth day, take 1 tablet the sixth day., Starting Tue 09/26/2016, Until Mon 10/02/2016, Print            This chart was dictated using voice recognition software/Dragon. Despite best efforts to proofread, errors can occur which can change the meaning. Any change was purely unintentional.    Orvil Feil, PA-C 09/26/16 2026    Myrna Blazer, MD 09/26/16 2329

## 2016-10-03 ENCOUNTER — Encounter: Payer: Self-pay | Admitting: *Deleted

## 2016-10-03 ENCOUNTER — Emergency Department
Admission: EM | Admit: 2016-10-03 | Discharge: 2016-10-03 | Disposition: A | Discharge status: Home / Self Care | Payer: Self-pay | Attending: Emergency Medicine | Admitting: Emergency Medicine

## 2016-10-03 DIAGNOSIS — H9209 Otalgia, unspecified ear: Secondary | ICD-10-CM | POA: Insufficient documentation

## 2016-10-03 DIAGNOSIS — Z5321 Procedure and treatment not carried out due to patient leaving prior to being seen by health care provider: Secondary | ICD-10-CM | POA: Insufficient documentation

## 2016-10-03 NOTE — ED Triage Notes (Signed)
Pt has right ear pain.   Pt was seen here last week with similar sx and states i'm not any better.

## 2016-10-03 NOTE — ED Notes (Signed)
No answer when called from lobby 

## 2016-10-04 ENCOUNTER — Emergency Department
Admission: EM | Admit: 2016-10-04 | Discharge: 2016-10-04 | Disposition: A | Discharge status: Home / Self Care | Payer: Self-pay | Attending: Emergency Medicine | Admitting: Emergency Medicine

## 2016-10-04 DIAGNOSIS — Z79899 Other long term (current) drug therapy: Secondary | ICD-10-CM | POA: Insufficient documentation

## 2016-10-04 DIAGNOSIS — H6121 Impacted cerumen, right ear: Secondary | ICD-10-CM | POA: Insufficient documentation

## 2016-10-04 NOTE — ED Triage Notes (Signed)
Pt reports FB in right ear x2 weeks.

## 2016-10-04 NOTE — ED Notes (Signed)
See triage note  States he feels like there is something in right ear  Hx of same ..recently treated for sinusitis  Also tried OTC ear flushes w/o relief

## 2016-10-04 NOTE — ED Provider Notes (Signed)
[facility] Emergency Department Provider Note   ____________________________________________   First MD Initiated Contact with Patient 10/04/16 1040     (approximate)  I have reviewed the triage vital signs and the nursing notes.   HISTORY  Chief Complaint Foreign Body in Ear    HPI Shaune SpittleMichael Metzger is a 36 y.o. male patient complaining of foreign body right ear for 2 weeks. Patient was seen last week and diagnosed with otitis media and placed on amoxicillin. Patient has been using over-the-counter ear drops to loosen the suspected wax in his ear. Patient states still unable to dislodge foreign body. Patient states continue hearing loss secondary to wax buildup. Patient denies pain with this complaint.   No past medical history on file.  There are no active problems to display for this patient.   No past surgical history on file.  Prior to Admission medications   Medication Sig Start Date End Date Taking? Authorizing Provider  amoxicillin-clavulanate (AUGMENTIN) 875-125 MG tablet Take 1 tablet by mouth 2 (two) times daily. 09/26/16 10/06/16  Orvil FeilWoods, Jaclyn M, PA-C  fluticasone (FLONASE) 50 MCG/ACT nasal spray Place 1 spray into both nostrils daily. 08/15/16 08/15/17  Rebecka ApleyWebster, Allison P, MD  ibuprofen (ADVIL,MOTRIN) 800 MG tablet Take 1 tablet (800 mg total) by mouth every 8 (eight) hours as needed. 01/04/15   Emily FilbertWilliams, Jonathan E, MD  ondansetron (ZOFRAN ODT) 8 MG disintegrating tablet Take 1 tablet (8 mg total) by mouth every 8 (eight) hours as needed for nausea or vomiting. 01/05/15   Joni ReiningSmith, Akayla Brass K, PA-C  penicillin v potassium (VEETID) 250 MG tablet Take 1 tablet (250 mg total) by mouth 4 (four) times daily. 01/04/15   Emily FilbertWilliams, Jonathan E, MD  pseudoephedrine (SUDAFED) 30 MG tablet Take 1 tablet (30 mg total) by mouth every 6 (six) hours as needed for congestion. 08/15/16 08/15/17  Rebecka ApleyWebster, Allison P, MD    Allergies Oxycodone; Percocet  [oxycodone-acetaminophen]; Valium [diazepam]; and Vicodin [hydrocodone-acetaminophen]  No family history on file.  Social History Social History  Substance Use Topics  . Smoking status: Never Smoker  . Smokeless tobacco: Never Used  . Alcohol use No     Comment: Occ    Review of Systems  Constitutional: No fever/chills Eyes: No visual changes. ENT: No sore throat. Wax buildup of right ear Cardiovascular: Denies chest pain. Respiratory: Denies shortness of breath. Gastrointestinal: No abdominal pain.  No nausea, no vomiting.  No diarrhea.  No constipation. Genitourinary: Negative for dysuria. Musculoskeletal: Negative for back pain. Skin: Negative for rash. Neurological: Negative for headaches, focal weakness or numbness. Allergic/Immunilogical: See medication list ____________________________________________   PHYSICAL EXAM:  VITAL SIGNS: ED Triage Vitals [10/04/16 1025]  Enc Vitals Group     BP 136/84     Pulse Rate 66     Resp 15     Temp 98.1 F (36.7 C)     Temp Source Oral     SpO2 97 %     Weight      Height      Head Circumference      Peak Flow      Pain Score 0     Pain Loc      Pain Edu?      Excl. in GC?     Constitutional: Alert and oriented. Well appearing and in no acute distress. Head: Atraumatic. Nose: No congestion/rhinnorhea. EARS: TM not visible secondary to impaction. Mouth/Throat: Mucous membranes are moist.  Oropharynx non-erythematous. Neck: No stridor.  No  cervical spine tenderness to palpation. Cardiovascular: Normal rate, regular rhythm. Grossly normal heart sounds.  Good peripheral circulation. Respiratory: Normal respiratory effort.  No retractions. Lungs CTAB. Neurologic:  Normal speech and language. No gross focal neurologic deficits are appreciated. No gait instability. Skin:  Skin is warm, dry and intact. No rash noted. Psychiatric: Mood and affect are normal. Speech and behavior are  normal.  ____________________________________________   LABS (all labs ordered are listed, but only abnormal results are displayed)  Labs Reviewed - No data to display ____________________________________________  EKG   ____________________________________________  RADIOLOGY  No results found.  ____________________________________________   PROCEDURES  Procedure(s) performed: None  Procedures  Critical Care performed: No  ____________________________________________   INITIAL IMPRESSION / ASSESSMENT AND PLAN / ED COURSE  Pertinent labs & imaging results that were available during my care of the patient were reviewed by me and considered in my medical decision making (see chart for details).  Cerumen impaction right ear. Ear canal was clear with irrigation. Patient states him and has returned.      ____________________________________________   FINAL CLINICAL IMPRESSION(S) / ED DIAGNOSES  Final diagnoses:  Hearing loss of right ear due to cerumen impaction      NEW MEDICATIONS STARTED DURING THIS VISIT:  New Prescriptions   No medications on file     Note:  This document was prepared using Dragon voice recognition software and may include unintentional dictation errors.    Joni Reining, PA-C 10/04/16 1105    Minna Antis, MD 10/04/16 [phone]

## 2016-12-03 ENCOUNTER — Emergency Department
Admission: EM | Admit: 2016-12-03 | Discharge: 2016-12-03 | Disposition: A | Discharge status: Home / Self Care | Payer: Self-pay | Attending: Student in an Organized Health Care Education/Training Program | Admitting: Student in an Organized Health Care Education/Training Program

## 2016-12-03 ENCOUNTER — Encounter: Payer: Self-pay | Admitting: Emergency Medicine

## 2016-12-03 DIAGNOSIS — Z79899 Other long term (current) drug therapy: Secondary | ICD-10-CM | POA: Insufficient documentation

## 2016-12-03 DIAGNOSIS — H6123 Impacted cerumen, bilateral: Secondary | ICD-10-CM | POA: Insufficient documentation

## 2016-12-03 MED ORDER — ACETIC ACID 2 % OT SOLN: 4.0000 [drp] | Freq: Three times a day (TID) | OTIC | 0 refills | Status: DC

## 2016-12-03 NOTE — ED Notes (Signed)
See triage note   States he feels like his ears were clogged for several days   Now states having some diff hearing in left ear

## 2016-12-03 NOTE — Discharge Instructions (Signed)
Consider using OTC Debrox Ear Solution to keep canals clear. [facility] Use the prescription ear drops as directed for the next 7 days.

## 2016-12-03 NOTE — ED Provider Notes (Signed)
[facility] Emergency Department Provider Note ____________________________________________  Time seen: 1023  I have reviewed the triage vital signs and the nursing notes.  HISTORY  Chief Complaint  Hearing Problem  HPI Douglas Leonard is a 36 y.o. male Presents to the ED for evaluation of ear pressure and fullness as well as foreign body sensation or couple days. Patient has tried over-the-counter Somers eardrops but they have not helped. He presents today for evaluation of nonpainful ear pressure and decreased hearing.  History reviewed. No pertinent past medical history.  There are no active problems to display for this patient.   History reviewed. No pertinent surgical history.  Prior to Admission medications   Medication Sig Start Date End Date Taking? Authorizing Provider  acetic acid (VOSOL) 2 % otic solution Place 4 drops into both ears 3 (three) times daily. 12/03/16   Isidoro Santillana, Charlesetta IvoryJenise V Bacon, PA-C  fluticasone (FLONASE) 50 MCG/ACT nasal spray Place 1 spray into both nostrils daily. 08/15/16 08/15/17  Rebecka ApleyWebster, Allison P, MD  ibuprofen (ADVIL,MOTRIN) 800 MG tablet Take 1 tablet (800 mg total) by mouth every 8 (eight) hours as needed. 01/04/15   Emily FilbertWilliams, Jonathan E, MD  ondansetron (ZOFRAN ODT) 8 MG disintegrating tablet Take 1 tablet (8 mg total) by mouth every 8 (eight) hours as needed for nausea or vomiting. 01/05/15   Joni ReiningSmith, Ronald K, PA-C  penicillin v potassium (VEETID) 250 MG tablet Take 1 tablet (250 mg total) by mouth 4 (four) times daily. 01/04/15   Emily FilbertWilliams, Jonathan E, MD  pseudoephedrine (SUDAFED) 30 MG tablet Take 1 tablet (30 mg total) by mouth every 6 (six) hours as needed for congestion. 08/15/16 08/15/17  Rebecka ApleyWebster, Allison P, MD   Allergies Oxycodone; Percocet [oxycodone-acetaminophen]; Valium [diazepam]; and Vicodin [hydrocodone-acetaminophen]  History reviewed. No pertinent family history.  Social History Social History  Substance Use  Topics  . Smoking status: Never Smoker  . Smokeless tobacco: Never Used  . Alcohol use No     Comment: Occ    Review of Systems  Constitutional: Negative for fever. Eyes: Negative for visual changes. ENT: Negative for sore throat. Decreased hearing as above. Cardiovascular: Negative for chest pain. Respiratory: Negative for shortness of breath. Gastrointestinal: Negative for abdominal pain, vomiting and diarrhea. Neurological: Negative for headaches, focal weakness or numbness. ____________________________________________  PHYSICAL EXAM:  VITAL SIGNS: ED Triage Vitals  Enc Vitals Group     BP 12/03/16 0928 129/87     Pulse Rate 12/03/16 0928 84     Resp 12/03/16 0928 16     Temp 12/03/16 0928 98.5 F (36.9 C)     Temp Source 12/03/16 0928 Oral     SpO2 12/03/16 0928 96 %     Weight 12/03/16 0925 200 lb (90.7 kg)     Height 12/03/16 0925 6' (1.829 m)     Head Circumference --      Peak Flow --      Pain Score --      Pain Loc --      Pain Edu? --      Excl. in GC? --     Constitutional: Alert and oriented. Well appearing and in no distress. Head: Normocephalic and atraumatic. Ears: normal external ears and canals. The TMs are obscured bilaterally by soft cerumen. No pain with manipulation of the pinna or tragus. Neck: Supple. No thyromegaly. Cardiovascular: Normal rate, regular rhythm. Normal distal pulses. Respiratory: Normal respiratory effort. Neurologic:  Normal gait without ataxia. Normal speech and language. No gross  focal neurologic deficits are appreciated. Skin:  Skin is warm, dry and intact. No rash noted. ____________________________________________  PROCEDURES  CERUMEN REMOVAL Performed by: Lissa Hoard Authorized by: Lissa Hoard Consent: Verbal consent obtained. Risks and benefits: risks, benefits and alternatives were discussed Consent given by: parent/patient Patient identity confirmed: provided demographic data Prepped  and Draped in normal fashion  Ear(s) Involved: bilateral  Irrigation Method: 18G IV cannula + 20 cc syringe  Irrigation Solution: 1:1 mixture of Hydrogen peroxide:water  Resolution: Successful cerumen impaction removal. TM(s) visualized and intact.  Patient tolerance: Patient tolerated the procedure well with no immediate complications. ____________________________________________  INITIAL IMPRESSION / ASSESSMENT AND PLAN / ED COURSE  Patient was ED evaluation of decreased hearing bilaterally. He was found to have a bilateral cerumen impaction. Ear wax was removed without difficulty using an ear wash method outlined above. He'll be discharged with a prescription for acetic acid drops with the ears and will follow-up with his primary care provider for ongoing symptom management  ____________________________________________  FINAL CLINICAL IMPRESSION(S) / ED DIAGNOSES  Final diagnoses:  Bilateral impacted cerumen      Lissa Hoard, PA-C 12/03/16 1146    Willy Eddy, MD 12/03/16 1458

## 2016-12-03 NOTE — ED Triage Notes (Signed)
Pt c/o feeling like something is in left ear for couple days. Has had some difficulty hearing as well.  Tried swimmer ear drops because feels like water in ear but has not helped. Ambulatory to triage. NAD.

## 2016-12-21 ENCOUNTER — Encounter: Payer: Self-pay | Admitting: *Deleted

## 2016-12-21 ENCOUNTER — Emergency Department
Admission: EM | Admit: 2016-12-21 | Discharge: 2016-12-21 | Disposition: A | Discharge status: Home / Self Care | Payer: Self-pay | Attending: Emergency Medicine | Admitting: Emergency Medicine

## 2016-12-21 DIAGNOSIS — Z79899 Other long term (current) drug therapy: Secondary | ICD-10-CM | POA: Insufficient documentation

## 2016-12-21 DIAGNOSIS — H60503 Unspecified acute noninfective otitis externa, bilateral: Secondary | ICD-10-CM | POA: Insufficient documentation

## 2016-12-21 MED ORDER — CIPROFLOXACIN-DEXAMETHASONE 0.3-0.1 % OT SUSP: 4.0000 [drp] | Freq: Two times a day (BID) | OTIC | 0 refills | Status: AC

## 2016-12-21 NOTE — ED Provider Notes (Signed)
[facility] Emergency Department Provider Note   ____________________________________________   First MD Initiated Contact with Patient 12/21/16 0501     (approximate)  I have reviewed the triage vital signs and the nursing notes.   HISTORY  Chief Complaint Otalgia    HPI Douglas Leonard is a 36 y.o. male Who comes into the hospital today stating that his ears have been draining when he sleeps at night. He reports that the last 2 times he has been herehe had wax buildup in his ear. He reports that he doesn't use a Q-tip just peroxide and a syringe. He reports that he doesn't use it often but he states that when he does it just causes fluid buildup in his ear. The patient reports that his ears feel clogged up again. He can barely hear out of them. He is unsure what is wrong with them and wanted to get them evaluated. He denies any pain or fevers at this time.   History reviewed. No pertinent past medical history.  There are no active problems to display for this patient.   History reviewed. No pertinent surgical history.  Prior to Admission medications   Medication Sig Start Date End Date Taking? Authorizing Provider  acetic acid (VOSOL) 2 % otic solution Place 4 drops into both ears 3 (three) times daily. 12/03/16   Menshew, Charlesetta Ivory, PA-C  ciprofloxacin-dexamethasone (CIPRODEX) OTIC suspension Place 4 drops into both ears 2 (two) times daily. 12/21/16 12/28/16  Rebecka Apley, MD  fluticasone (FLONASE) 50 MCG/ACT nasal spray Place 1 spray into both nostrils daily. 08/15/16 08/15/17  Rebecka Apley, MD  ibuprofen (ADVIL,MOTRIN) 800 MG tablet Take 1 tablet (800 mg total) by mouth every 8 (eight) hours as needed. 01/04/15   Emily Filbert, MD  ondansetron (ZOFRAN ODT) 8 MG disintegrating tablet Take 1 tablet (8 mg total) by mouth every 8 (eight) hours as needed for nausea or vomiting. 01/05/15   Joni Reining, PA-C  penicillin v potassium  (VEETID) 250 MG tablet Take 1 tablet (250 mg total) by mouth 4 (four) times daily. 01/04/15   Emily Filbert, MD  pseudoephedrine (SUDAFED) 30 MG tablet Take 1 tablet (30 mg total) by mouth every 6 (six) hours as needed for congestion. 08/15/16 08/15/17  Rebecka Apley, MD    Allergies Oxycodone; Percocet [oxycodone-acetaminophen]; Valium [diazepam]; and Vicodin [hydrocodone-acetaminophen]  No family history on file.  Social History Social History  Substance Use Topics  . Smoking status: Never Smoker  . Smokeless tobacco: Never Used  . Alcohol use No     Comment: Occ    Review of Systems  Constitutional: No fever/chills Eyes: No visual changes. ENT: clogged ears and drainage Cardiovascular: Denies chest pain. Respiratory: Denies shortness of breath. Gastrointestinal: No abdominal pain.  No nausea, no vomiting.  No diarrhea.  No constipation. Genitourinary: Negative for dysuria. Musculoskeletal: Negative for back pain. Skin: Negative for rash. Neurological: Negative for headaches, focal weakness or numbness.  ____________________________________________   PHYSICAL EXAM:  VITAL SIGNS: ED Triage Vitals  Enc Vitals Group     BP 12/21/16 0251 129/79     Pulse Rate 12/21/16 0250 63     Resp 12/21/16 0250 18     Temp 12/21/16 0250 98.4 F (36.9 C)     Temp Source 12/21/16 0250 Oral     SpO2 12/21/16 0250 99 %     Weight 12/21/16 0251 200 lb (90.7 kg)     Height 12/21/16 0251  6' (1.829 m)     Head Circumference --      Peak Flow --      Pain Score 12/21/16 0250 0     Pain Loc --      Pain Edu? --      Excl. in GC? --     Constitutional: Alert and oriented. Well appearing and in mild distress. Ears: ear canals erythematous bilaterally with some debris noted, TMs are grey with no fluid or bulging but no good light reflex noted bilaterally. Eyes: Conjunctivae are normal. PERRL. EOMI. Head: Atraumatic. Nose: No congestion/rhinnorhea. Mouth/Throat: Mucous  membranes are moist.  Oropharynx non-erythematous. Cardiovascular: Normal rate, regular rhythm. Grossly normal heart sounds.  Good peripheral circulation. Respiratory: Normal respiratory effort.  No retractions. Lungs CTAB. Gastrointestinal: Soft and nontender. No distention. Positive bowel sounds Musculoskeletal: No lower extremity tenderness nor edema.   Neurologic:  Normal speech and language.  Skin:  Skin is warm, dry and intact.  Psychiatric: Mood and affect are normal.   ____________________________________________   LABS (all labs ordered are listed, but only abnormal results are displayed)  Labs Reviewed - No data to display ____________________________________________  EKG  none ____________________________________________  RADIOLOGY  No results found.  ____________________________________________   PROCEDURES  Procedure(s) performed: None  Procedures  Critical Care performed: No  ____________________________________________   INITIAL IMPRESSION / ASSESSMENT AND PLAN / ED COURSE  Pertinent labs & imaging results that were available during my care of the patient were reviewed by me and considered in my medical decision making (see chart for details).  This is a 36 year old male who comes into the hospital today with some ear drainage and clogged ears. I looked in the patient's TMs and they definitely have some erythema to bilateral canals with some drainage and debridement. I will write the patient a prescription for Ciprodex. It does not appear that he has any TM rupture at this time. He should follow back up with ENT.      ____________________________________________   FINAL CLINICAL IMPRESSION(S) / ED DIAGNOSES  Final diagnoses:  Acute otitis externa of both ears, unspecified type      NEW MEDICATIONS STARTED DURING THIS VISIT:  New Prescriptions   CIPROFLOXACIN-DEXAMETHASONE (CIPRODEX) OTIC SUSPENSION    Place 4 drops into both ears 2 (two)  times daily.     Note:  This document was prepared using Dragon voice recognition software and may include unintentional dictation errors.    Rebecka ApleyWebster, [place] Gustafson P, MD 12/21/16 [phone]

## 2016-12-21 NOTE — Discharge Instructions (Signed)
Please follow up with ENT for further evaluation of your ears

## 2016-12-21 NOTE — ED Triage Notes (Signed)
Pt reports feeling pressure and fluid in both ears.   Sx for 1 day.  Pt states he used swimmers ear medicine without relief.  Pt alert.

## 2016-12-22 ENCOUNTER — Emergency Department
Admission: EM | Admit: 2016-12-22 | Discharge: 2016-12-22 | Disposition: A | Discharge status: Home / Self Care | Payer: No Typology Code available for payment source | Attending: Emergency Medicine | Admitting: Emergency Medicine

## 2016-12-22 ENCOUNTER — Emergency Department: Payer: No Typology Code available for payment source

## 2016-12-22 DIAGNOSIS — S20212A Contusion of left front wall of thorax, initial encounter: Secondary | ICD-10-CM | POA: Insufficient documentation

## 2016-12-22 DIAGNOSIS — Y999 Unspecified external cause status: Secondary | ICD-10-CM | POA: Diagnosis not present

## 2016-12-22 DIAGNOSIS — Y92410 Unspecified street and highway as the place of occurrence of the external cause: Secondary | ICD-10-CM | POA: Insufficient documentation

## 2016-12-22 DIAGNOSIS — Y939 Activity, unspecified: Secondary | ICD-10-CM | POA: Diagnosis not present

## 2016-12-22 DIAGNOSIS — Z79899 Other long term (current) drug therapy: Secondary | ICD-10-CM | POA: Diagnosis not present

## 2016-12-22 DIAGNOSIS — M791 Myalgia: Secondary | ICD-10-CM | POA: Insufficient documentation

## 2016-12-22 DIAGNOSIS — S29001A Unspecified injury of muscle and tendon of front wall of thorax, initial encounter: Secondary | ICD-10-CM | POA: Diagnosis present

## 2016-12-22 DIAGNOSIS — M7918 Myalgia, other site: Secondary | ICD-10-CM

## 2016-12-22 MED ORDER — KETOROLAC TROMETHAMINE 60 MG/2ML IM SOLN
60.0000 mg | Freq: Once | INTRAMUSCULAR | Status: AC
  Administered 2016-12-22: 60 mg via INTRAMUSCULAR
  Filled 2016-12-22: qty 2

## 2016-12-22 MED ORDER — ETODOLAC 200 MG PO CAPS: 200.0000 mg | ORAL_CAPSULE | Freq: Three times a day (TID) | ORAL | 0 refills | Status: DC
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
## 2016-12-22 NOTE — ED Notes (Addendum)
Pt alert and oriented sitting up on the side of the bed speaking with [place] [employer]. Respirations equal and unlabored.

## 2016-12-22 NOTE — ED Notes (Signed)
Patient transported to X-ray 

## 2016-12-22 NOTE — ED Notes (Signed)
EDP in room to give update on radiology results.
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
## 2016-12-26 ENCOUNTER — Encounter: Payer: Self-pay | Admitting: Emergency Medicine

## 2016-12-26 ENCOUNTER — Emergency Department: Payer: No Typology Code available for payment source

## 2016-12-26 ENCOUNTER — Emergency Department
Admission: EM | Admit: 2016-12-26 | Discharge: 2016-12-26 | Disposition: A | Discharge status: Home / Self Care | Payer: No Typology Code available for payment source | Attending: Emergency Medicine | Admitting: Emergency Medicine

## 2016-12-26 DIAGNOSIS — M542 Cervicalgia: Secondary | ICD-10-CM | POA: Diagnosis not present

## 2016-12-26 DIAGNOSIS — R0789 Other chest pain: Secondary | ICD-10-CM | POA: Diagnosis not present

## 2016-12-26 DIAGNOSIS — M549 Dorsalgia, unspecified: Secondary | ICD-10-CM | POA: Diagnosis present

## 2016-12-26 DIAGNOSIS — Z79899 Other long term (current) drug therapy: Secondary | ICD-10-CM | POA: Diagnosis not present

## 2016-12-26 DIAGNOSIS — M546 Pain in thoracic spine: Secondary | ICD-10-CM

## 2016-12-26 DIAGNOSIS — M7918 Myalgia, other site: Secondary | ICD-10-CM

## 2016-12-26 MED ORDER — LIDOCAINE 5 % EX PTCH: 1.0000 | MEDICATED_PATCH | Freq: Two times a day (BID) | CUTANEOUS | 0 refills | Status: AC

## 2016-12-26 MED ORDER — CYCLOBENZAPRINE HCL 10 MG PO TABS: 10.0000 mg | ORAL_TABLET | Freq: Three times a day (TID) | ORAL | 0 refills | Status: DC | PRN

## 2016-12-26 MED ORDER — KETOROLAC TROMETHAMINE 60 MG/2ML IM SOLN
60.0000 mg | Freq: Once | INTRAMUSCULAR | Status: AC
Start: 2016-12-26 — End: 2016-12-26
  Administered 2016-12-26: 60 mg via INTRAMUSCULAR

## 2016-12-26 MED ORDER — LIDOCAINE 5 % EX PTCH
1.0000 | MEDICATED_PATCH | CUTANEOUS | Status: DC
  Administered 2016-12-26: 1 via TRANSDERMAL
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
## 2019-12-13 ENCOUNTER — Other Ambulatory Visit: Payer: Self-pay

## 2019-12-13 ENCOUNTER — Emergency Department
Admission: EM | Admit: 2019-12-13 | Discharge: 2019-12-14 | Disposition: A | Discharge status: Home / Self Care | Payer: BLUE CROSS/BLUE SHIELD | Attending: Emergency Medicine | Admitting: Emergency Medicine

## 2019-12-13 DIAGNOSIS — K0889 Other specified disorders of teeth and supporting structures: Secondary | ICD-10-CM

## 2019-12-13 DIAGNOSIS — K029 Dental caries, unspecified: Secondary | ICD-10-CM | POA: Diagnosis not present

## 2019-12-13 NOTE — ED Triage Notes (Signed)
Patient c/o right dental pain

## 2019-12-14 MED ORDER — IBUPROFEN 400 MG PO TABS
400.0000 mg | ORAL_TABLET | Freq: Once | ORAL | Status: AC
  Administered 2019-12-14: 400 mg via ORAL
  Filled 2019-12-14: qty 1

## 2019-12-14 MED ORDER — AMOXICILLIN 500 MG PO CAPS
500.0000 mg | ORAL_CAPSULE | Freq: Once | ORAL | Status: AC
  Administered 2019-12-14: 500 mg via ORAL
  Filled 2019-12-14: qty 1

## 2019-12-14 MED ORDER — IBUPROFEN 600 MG PO TABS: 600.0000 mg | ORAL_TABLET | Freq: Three times a day (TID) | ORAL | 0 refills | Status: DC | PRN

## 2019-12-14 MED ORDER — LIDOCAINE VISCOUS HCL 2 % MT SOLN
15.0000 mL | Freq: Once | OROMUCOSAL | Status: AC
  Administered 2019-12-14: 15 mL via OROMUCOSAL
  Filled 2019-12-14: qty 15

## 2019-12-14 MED ORDER — AMOXICILLIN 500 MG PO CAPS: 500.0000 mg | ORAL_CAPSULE | Freq: Three times a day (TID) | ORAL | 0 refills | Status: DC

## 2019-12-14 NOTE — ED Provider Notes (Signed)
[facility] Emergency Department Provider Note   ____________________________________________   First MD Initiated Contact with Patient 12/14/19 0012     (approximate)  I have reviewed the triage vital signs and the nursing notes.   HISTORY  Chief Complaint Dental Pain    HPI Douglas Leonard is a 39 y.o. male who presents to the ED from home with a chief complaint of right dental pain.  Complains of pain to his right lower molar x1 day.  Denies fever, chills, oral swelling, nausea or vomiting.      Past medical history None  There are no problems to display for this patient.   Past Surgical History:  Procedure Laterality Date  . SHOULDER SURGERY      Prior to Admission medications   Medication Sig Start Date End Date Taking? Authorizing Provider  acetic acid (VOSOL) 2 % otic solution Place 4 drops into both ears 3 (three) times daily. 12/03/16   Menshew, Charlesetta Ivory, PA-C  amoxicillin (AMOXIL) 500 MG capsule Take 1 capsule (500 mg total) by mouth 3 (three) times daily. 12/14/19   Irean Hong, MD  cyclobenzaprine (FLEXERIL) 10 MG tablet Take 1 tablet (10 mg total) by mouth 3 (three) times daily as needed for muscle spasms. 12/26/16   Rebecka Apley, MD  etodolac (LODINE) 200 MG capsule Take 1 capsule (200 mg total) by mouth every 8 (eight) hours. 12/22/16   Rebecka Apley, MD  fluticasone (FLONASE) 50 MCG/ACT nasal spray Place 1 spray into both nostrils daily. 08/15/16 08/15/17  Rebecka Apley, MD  ibuprofen (ADVIL) 600 MG tablet Take 1 tablet (600 mg total) by mouth every 8 (eight) hours as needed. 12/14/19   Irean Hong, MD  ondansetron (ZOFRAN ODT) 8 MG disintegrating tablet Take 1 tablet (8 mg total) by mouth every 8 (eight) hours as needed for nausea or vomiting. 01/05/15   Joni Reining, PA-C  penicillin v potassium (VEETID) 250 MG tablet Take 1 tablet (250 mg total) by mouth 4 (four) times daily. 01/04/15   Emily Filbert, MD     Allergies Oxycodone, Percocet [oxycodone-acetaminophen], Valium [diazepam], and Vicodin [hydrocodone-acetaminophen]  No family history on file.  Social History Social History   Tobacco Use  . Smoking status: Never Smoker  . Smokeless tobacco: Never Used  Substance Use Topics  . Alcohol use: No    Comment: Occ  . Drug use: No    Review of Systems  Constitutional: No fever/chills Eyes: No visual changes. ENT: Positive for dentalgia.  No sore throat. Cardiovascular: Denies chest pain. Respiratory: Denies shortness of breath. Gastrointestinal: No abdominal pain.  No nausea, no vomiting.  No diarrhea.  No constipation. Genitourinary: Negative for dysuria. Musculoskeletal: Negative for back pain. Skin: Negative for rash. Neurological: Negative for headaches, focal weakness or numbness.   ____________________________________________   PHYSICAL EXAM:  VITAL SIGNS: ED Triage Vitals  Enc Vitals Group     BP 12/13/19 2218 (!) 146/108     Pulse Rate 12/13/19 2218 97     Resp 12/13/19 2218 18     Temp 12/13/19 2218 99.3 F (37.4 C)     Temp src --      SpO2 12/13/19 2218 98 %     Weight 12/13/19 2219 90 lb 11.2 oz (41.1 kg)     Height 12/13/19 2219 6' (1.829 m)     Head Circumference --      Peak Flow --      Pain  Score 12/13/19 2218 8     Pain Loc --      Pain Edu? --      Excl. in GC? --     Constitutional: Alert and oriented. Well appearing and in no acute distress. Eyes: Conjunctivae are normal. PERRL. EOMI. Head: Atraumatic. Nose: No congestion/rhinnorhea. Mouth/Throat: Mucous membranes are moist.  Widespread dental decay.  Right lower second molar with decay and tender to palpation with tongue blade.  No surrounding gum abscess.   Neck: No stridor.   Cardiovascular: Normal rate, regular rhythm. Grossly normal heart sounds.  Good peripheral circulation. Respiratory: Normal respiratory effort.  No retractions. Lungs CTAB. Gastrointestinal: Soft and  nontender. No distention. No abdominal bruits. No CVA tenderness. Musculoskeletal: No lower extremity tenderness nor edema.  No joint effusions. Neurologic:  Normal speech and language. No gross focal neurologic deficits are appreciated. No gait instability. Skin:  Skin is warm, dry and intact. No rash noted. Psychiatric: Mood and affect are normal. Speech and behavior are normal.  ____________________________________________   LABS (all labs ordered are listed, but only abnormal results are displayed)  Labs Reviewed - No data to display ____________________________________________  EKG  None ____________________________________________  RADIOLOGY  ED MD interpretation: None  Official radiology report(s): No results found.  ____________________________________________   PROCEDURES  Procedure(s) performed (including Critical Care):  Procedures   ____________________________________________   INITIAL IMPRESSION / ASSESSMENT AND PLAN / ED COURSE  As part of my medical decision making, I reviewed the following data within the electronic MEDICAL RECORD NUMBER Nursing notes reviewed and incorporated, Notes from prior ED visits     Douglas Leonard was evaluated in Emergency Department on 12/14/2019 for the symptoms described in the history of present illness. He was evaluated in the context of the global COVID-19 pandemic, which necessitated consideration that the patient might be at risk for infection with the SARS-CoV-2 virus that causes COVID-19. Institutional protocols and algorithms that pertain to the evaluation of patients at risk for COVID-19 are in a state of rapid change based on information released by regulatory bodies including the CDC and federal and state organizations. These policies and algorithms were followed during the patient's care in the ED.    39 year old male presenting with dentalgia.  Will treat with amoxicillin, NSAIDs, lidocaine rinse.  Patient has a  Education officer, community and will follow up next week.  Strict return precautions given.  Patient verbalizes understanding and agrees with plan of care.      ____________________________________________   FINAL CLINICAL IMPRESSION(S) / ED DIAGNOSES  Final diagnoses:  Pain, dental  Dental caries     ED Discharge Orders         Ordered    ibuprofen (ADVIL) 600 MG tablet  Every 8 hours PRN        12/14/19 0019    amoxicillin (AMOXIL) 500 MG capsule  3 times daily        12/14/19 0019           Note:  This document was prepared using Dragon voice recognition software and may include unintentional dictation errors.   Irean Hong, MD 12/14/19 [phone]

## 2019-12-14 NOTE — Discharge Instructions (Addendum)
1. Take antibiotic as prescribed (Amoxicillin 500mg  three times daily x 7 days). 2. Take pain medicines as needed (Motrin #15). 3. Return to the ER for worsening symptoms, persistent vomiting, fever, difficulty breathing or other concerns.

## 2020-05-26 ENCOUNTER — Emergency Department
Admission: EM | Admit: 2020-05-26 | Discharge: 2020-05-26 | Disposition: A | Discharge status: Home / Self Care | Payer: BLUE CROSS/BLUE SHIELD | Attending: Emergency Medicine | Admitting: Emergency Medicine

## 2020-05-26 ENCOUNTER — Other Ambulatory Visit: Payer: Self-pay

## 2020-05-26 DIAGNOSIS — K047 Periapical abscess without sinus: Secondary | ICD-10-CM | POA: Insufficient documentation

## 2020-05-26 DIAGNOSIS — K029 Dental caries, unspecified: Secondary | ICD-10-CM | POA: Insufficient documentation

## 2020-05-26 MED ORDER — TRAMADOL HCL 50 MG PO TABS
50.0000 mg | ORAL_TABLET | Freq: Once | ORAL | Status: AC
  Administered 2020-05-26: 50 mg via ORAL
  Filled 2020-05-26: qty 1

## 2020-05-26 MED ORDER — LIDOCAINE VISCOUS HCL 2 % MT SOLN: 5.0000 mL | Freq: Four times a day (QID) | OROMUCOSAL | 0 refills | Status: DC | PRN

## 2020-05-26 MED ORDER — LIDOCAINE VISCOUS HCL 2 % MT SOLN
15.0000 mL | Freq: Once | OROMUCOSAL | Status: AC
  Administered 2020-05-26: 15 mL via OROMUCOSAL
  Filled 2020-05-26: qty 15

## 2020-05-26 MED ORDER — IBUPROFEN 600 MG PO TABS
600.0000 mg | ORAL_TABLET | Freq: Once | ORAL | Status: AC
  Administered 2020-05-26: 600 mg via ORAL
  Filled 2020-05-26: qty 1

## 2020-05-26 MED ORDER — AMOXICILLIN 500 MG PO CAPS: 500.0000 mg | ORAL_CAPSULE | Freq: Three times a day (TID) | ORAL | 0 refills | Status: DC

## 2020-05-26 MED ORDER — IBUPROFEN 600 MG PO TABS: 600.0000 mg | ORAL_TABLET | Freq: Three times a day (TID) | ORAL | 0 refills | Status: DC | PRN

## 2020-05-26 MED ORDER — TRAMADOL HCL 50 MG PO TABS: 50.0000 mg | ORAL_TABLET | Freq: Four times a day (QID) | ORAL | 0 refills | Status: DC | PRN

## 2020-05-26 NOTE — ED Provider Notes (Signed)
[facility] Emergency Department Provider Note   ____________________________________________   Event Date/Time   First MD Initiated Contact with Patient 05/26/20 0900     (approximate)  I have reviewed the triage vital signs and the nursing notes.   HISTORY  Chief Complaint Dental Pain    HPI Douglas Leonard is a 40 y.o. male patient complains of dental pain secondary to dental caries in the right lower molar area.  Patient pain has increased in the past 2 days.  Patient states call treating dentist and left voicemail but has not received a call back.  Rates pain as a 7/10.  Described pain is "achy".  No fever associated with complaint.  No palliative measure for complaint.      History reviewed. No pertinent past medical history.  There are no problems to display for this patient.   Past Surgical History:  Procedure Laterality Date  . SHOULDER SURGERY      Prior to Admission medications   Medication Sig Start Date End Date Taking? Authorizing Provider  amoxicillin (AMOXIL) 500 MG capsule Take 1 capsule (500 mg total) by mouth 3 (three) times daily. 05/26/20  Yes Joni Reining, PA-C  ibuprofen (ADVIL) 600 MG tablet Take 1 tablet (600 mg total) by mouth every 8 (eight) hours as needed. 05/26/20  Yes Joni Reining, PA-C  lidocaine (XYLOCAINE) 2 % solution Use as directed 5 mLs in the mouth or throat every 6 (six) hours as needed for mouth pain. 05/26/20  Yes Joni Reining, PA-C  traMADol (ULTRAM) 50 MG tablet Take 1 tablet (50 mg total) by mouth every 6 (six) hours as needed for moderate pain. 05/26/20  Yes Joni Reining, PA-C  acetic acid (VOSOL) 2 % otic solution Place 4 drops into both ears 3 (three) times daily. 12/03/16   Menshew, Charlesetta Ivory, PA-C  fluticasone (FLONASE) 50 MCG/ACT nasal spray Place 1 spray into both nostrils daily. 08/15/16 08/15/17  Rebecka Apley, MD    Allergies Oxycodone, Percocet [oxycodone-acetaminophen], Valium  [diazepam], and Vicodin [hydrocodone-acetaminophen]  No family history on file.  Social History Social History   Tobacco Use  . Smoking status: Never Smoker  . Smokeless tobacco: Never Used  Substance Use Topics  . Alcohol use: No    Comment: Occ  . Drug use: No    Review of Systems Constitutional: No fever/chills Eyes: No visual changes. ENT: No sore throat.  Dental pain. Cardiovascular: Denies chest pain. Respiratory: Denies shortness of breath. Gastrointestinal: No abdominal pain.  No nausea, no vomiting.  No diarrhea.  No constipation. Genitourinary: Negative for dysuria. Musculoskeletal: Negative for back pain. Skin: Negative for rash. Neurological: Negative for headaches, focal weakness or numbness. Allergic/Immunilogical: Diazepam and Percocets.  ____________________________________________   PHYSICAL EXAM:  VITAL SIGNS: ED Triage Vitals  Enc Vitals Group     BP 05/26/20 0855 (!) 136/101     Pulse Rate 05/26/20 0855 73     Resp 05/26/20 0855 17     Temp 05/26/20 0855 97.9 F (36.6 C)     Temp Source 05/26/20 0855 Oral     SpO2 05/26/20 0855 94 %     Weight 05/26/20 0852 200 lb (90.7 kg)     Height 05/26/20 0852 6' (1.829 m)     Head Circumference --      Peak Flow --      Pain Score 05/26/20 0852 7     Pain Loc --      Pain  Edu? --      Excl. in GC? --    Constitutional: Alert and oriented. Well appearing and in no acute distress. Eyes: Conjunctivae are normal. PERRL. EOMI. Head: Atraumatic. Nose: No congestion/rhinnorhea. Mouth/Throat: Mucous membranes are moist.  Gingiva edema and devitalized right lower molars.  Oropharynx non-erythematous. Hematological/Lymphatic/Immunilogical: No cervical lymphadenopathy. Cardiovascular: Normal rate, regular rhythm. Grossly normal heart sounds.  Good peripheral circulation.  Elevated diastolic blood pressure. Respiratory: Normal respiratory effort.  No retractions. Lungs CTAB. Skin:  Skin is warm, dry and  intact. No rash noted. Psychiatric: Mood and affect are normal. Speech and behavior are normal.  ____________________________________________   LABS (all labs ordered are listed, but only abnormal results are displayed)  Labs Reviewed - No data to display ____________________________________________  EKG   ____________________________________________  RADIOLOGY I, Joni Reining, personally viewed and evaluated these images (plain radiographs) as part of my medical decision making, as well as reviewing the written report by the radiologist.  ED MD interpretation:    Official radiology report(s): No results found.  ____________________________________________   PROCEDURES  Procedure(s) performed (including Critical Care):  Procedures   ____________________________________________   INITIAL IMPRESSION / ASSESSMENT AND PLAN / ED COURSE  As part of my medical decision making, I reviewed the following data within the electronic MEDICAL RECORD NUMBER         Patient presents with dental pain this is increasing over the past 2 days.  Physical exam reveals gingiva edema and devitalized right lower molars.  No abscess.  Patient given discharge care instruction advised take medication as directed.  Patient given list of dental clinics for follow-up care.      ____________________________________________   FINAL CLINICAL IMPRESSION(S) / ED DIAGNOSES  Final diagnoses:  Pain due to dental caries  Dental infection     ED Discharge Orders         Ordered    amoxicillin (AMOXIL) 500 MG capsule  3 times daily        05/26/20 0916    lidocaine (XYLOCAINE) 2 % solution  Every 6 hours PRN        05/26/20 0916    ibuprofen (ADVIL) 600 MG tablet  Every 8 hours PRN        05/26/20 0916    traMADol (ULTRAM) 50 MG tablet  Every 6 hours PRN        05/26/20 2706          *Please note:  Creston Klas was evaluated in Emergency Department on 05/26/2020 for the symptoms  described in the history of present illness. He was evaluated in the context of the global COVID-19 pandemic, which necessitated consideration that the patient might be at risk for infection with the SARS-CoV-2 virus that causes COVID-19. Institutional protocols and algorithms that pertain to the evaluation of patients at risk for COVID-19 are in a state of rapid change based on information released by regulatory bodies including the CDC and federal and state organizations. These policies and algorithms were followed during the patient's care in the ED.  Some ED evaluations and interventions may be delayed as a result of limited staffing during and the pandemic.*   Note:  This document was prepared using Dragon voice recognition software and may include unintentional dictation errors.    Joni Reining, PA-C 05/26/20 2376    Chesley Noon, MD 05/27/20 [phone]

## 2020-05-26 NOTE — ED Triage Notes (Signed)
Pt c/o right lower tooth pain for the past 2 days.

## 2020-05-26 NOTE — ED Notes (Signed)
See triage note Presents with dental pain  States he has a broken tooth  But having increased pain over the past 2 days

## 2020-05-26 NOTE — Discharge Instructions (Addendum)
Follow discharge care instruction take medication as directed. Follow-up with personal dentist or from list in your discharge care instruction.  OPTIONS FOR DENTAL FOLLOW UP CARE  [place] [facility] and [facility] - Local Safety Net Dental Clinics [email or web address]   [facility] [phone])  [facility] [phone])  [place] [phone] ext [phone])  [facility] [phone])  [facility] [phone]) This clinic caters to the indigent population and is on a lottery system. Location: [employer] [facility], [employer], [address], [place] Clinic Hours: Wednesdays from 6pm - 9pm, patients seen by a lottery system. For dates, call or go to [email or web address] Services: Cleanings, fillings and simple extractions. Payment Options: DENTAL WORK IS FREE OF CHARGE. Bring proof of income or support. Best way to get seen: Arrive at 5:15 pm - this is a lottery, NOT first come/first serve, so arriving earlier will not increase your chances of being seen.     [facility] [phone] Select option 1 for emergencies   Location: [facility], [place], [address], [place] Clinic Hours: No walk-ins accepted - call the day before to schedule an appointment. Check in times are 9:30 am and 1:30 pm. Services: Simple extractions, temporary fillings, pulpectomy/pulp debridement, uncomplicated abscess drainage. Payment Options: PAYMENT IS DUE AT THE TIME OF SERVICE.  Fee is usually $100-200, additional surgical procedures (e.g. abscess drainage) may be extra. Cash, checks, Visa/MasterCard accepted.  Can file Medicaid if patient is covered for dental - patient should call case worker to check. No discount for [facility] patients. Best way to get seen: MUST call the day before  and get onto the schedule. Can usually be seen the next 1-2 days. No walk-ins accepted.     [facility] [phone]   Location: [facility], [address] [place] Clinic Hours: M, W, Th, F 8am or 1:30pm, Tues 9a or 1:30 - first come/first served. Services: Simple extractions, temporary fillings, uncomplicated abscess drainage.  You do not need to be an [facility] resident. Payment Options: PAYMENT IS DUE AT THE TIME OF SERVICE. Dental insurance, otherwise sliding scale - bring proof of income or support. Depending on income and treatment needed, cost is usually $50-200. Best way to get seen: Arrive early as it is first come/first served.     [facility] [phone]   Location: [address] Clinic Hours: Mon-Thu 8a-5p Services: Most basic dental services including extractions and fillings. Payment Options: PAYMENT IS DUE AT THE TIME OF SERVICE. Sliding scale, up to 50% off - bring proof if income or support. Medicaid with dental option accepted. Best way to get seen: Call to schedule an appointment, can usually be seen within 2 weeks OR they will try to see walk-ins - show up at 8a or 2p (you may have to wait).     [facility] [phone] [place] RESIDENTS ONLY   Location: [facility], [address], [place], [place] [address] Clinic Hours: By appointment only. Monday - Thursday 8am-5pm, Friday 8am-12pm Services: Cleanings, fillings, extractions. Payment Options: PAYMENT IS DUE AT THE TIME OF SERVICE. Cash, Visa or MasterCard. Sliding scale - $30 minimum per service. Best way to get seen: Come in to office, complete packet and make an appointment - need proof of income or support monies for each household member and proof of [facility] residence. Usually takes about  a month to get in.     [facility] [phone]   Location: [address] [place] Clinic Hours: Walk-in Urgent Care Dental Services are offered Monday-Friday mornings only. The numbers of emergencies accepted daily is limited to the number of providers available. Maximum 15 - Mondays, Wednesdays & Thursdays Maximum 10 - Tuesdays & Fridays Services: You do not need to be a [facility] resident to be seen for a dental emergency. Emergencies are defined as pain, swelling, abnormal bleeding, or dental trauma. Walkins will receive x-rays if needed. NOTE: Dental cleaning is not an emergency. Payment Options: PAYMENT IS DUE AT THE TIME OF SERVICE. Minimum co-pay is $40.00 for uninsured patients. Minimum co-pay is $3.00 for Medicaid with dental coverage. Dental Insurance is accepted and must be presented at time of visit. Medicare does not cover dental. Forms of payment: Cash, credit card, checks. Best way to get seen: If not previously registered with the clinic, walk-in dental registration begins at 7:15 am and is on a first come/first serve basis. If previously registered with the clinic, call to make an appointment.     [facility] [place] ONLY   Location: [address], [place], [place] Clinic Hours: Mon-Thu 10a-2p Services: Extractions only! Payment Options: FREE (donations accepted) - bring proof of income or support Best way to get seen: Call and schedule an appointment OR come at 8am on the 1st Monday of every month (except for holidays) when it is first come/first served.     [facility] [phone]   Location: [place], [place] Clinic Hours: Friday mornings Services, Payment Options, Best way to get seen: Call for info

## 2020-06-24 ENCOUNTER — Other Ambulatory Visit: Payer: Self-pay

## 2020-06-24 DIAGNOSIS — R350 Frequency of micturition: Secondary | ICD-10-CM | POA: Insufficient documentation

## 2020-06-24 DIAGNOSIS — Z5321 Procedure and treatment not carried out due to patient leaving prior to being seen by health care provider: Secondary | ICD-10-CM | POA: Insufficient documentation

## 2020-06-24 NOTE — ED Triage Notes (Signed)
Pt states he has urinary frequency that started today. Pt denies burning with urination or any fevers at home.

## 2020-06-25 ENCOUNTER — Encounter: Payer: Self-pay | Admitting: Intensive Care

## 2020-06-25 ENCOUNTER — Emergency Department: Payer: Self-pay

## 2020-06-25 ENCOUNTER — Other Ambulatory Visit: Payer: Self-pay

## 2020-06-25 ENCOUNTER — Emergency Department
Admission: EM | Admit: 2020-06-25 | Discharge: 2020-06-25 | Disposition: A | Discharge status: Home / Self Care | Payer: Self-pay | Attending: Emergency Medicine | Admitting: Emergency Medicine

## 2020-06-25 DIAGNOSIS — N201 Calculus of ureter: Secondary | ICD-10-CM | POA: Insufficient documentation

## 2020-06-25 DIAGNOSIS — R109 Unspecified abdominal pain: Secondary | ICD-10-CM

## 2020-06-25 LAB — CBC
HCT: 46.1 % (ref 39.0–52.0)
Hemoglobin: 16.4 g/dL (ref 13.0–17.0)
MCH: 31.3 pg (ref 26.0–34.0)
MCHC: 35.6 g/dL (ref 30.0–36.0)
MCV: 88 fL (ref 80.0–100.0)
Platelets: 250 10*3/uL (ref 150–400)
RBC: 5.24 MIL/uL (ref 4.22–5.81)
RDW: 11.9 % (ref 11.5–15.5)
WBC: 9.1 10*3/uL (ref 4.0–10.5)
nRBC: 0 % (ref 0.0–0.2)

## 2020-06-25 LAB — URINALYSIS, COMPLETE (UACMP) WITH MICROSCOPIC
Bacteria, UA: NONE SEEN
Bilirubin Urine: NEGATIVE
Glucose, UA: NEGATIVE mg/dL
Ketones, ur: 20 mg/dL — AB
Leukocytes,Ua: NEGATIVE
Nitrite: NEGATIVE
Protein, ur: NEGATIVE mg/dL
Specific Gravity, Urine: 1.017 (ref 1.005–1.030)
pH: 5 (ref 5.0–8.0)

## 2020-06-25 LAB — BASIC METABOLIC PANEL
Anion gap: 9 (ref 5–15)
BUN: 15 mg/dL (ref 6–20)
CO2: 25 mmol/L (ref 22–32)
Calcium: 9.7 mg/dL (ref 8.9–10.3)
Chloride: 106 mmol/L (ref 98–111)
Creatinine, Ser: 0.93 mg/dL (ref 0.61–1.24)
GFR, Estimated: >60 mL/min (ref >60)
Glucose, Bld: 95 mg/dL (ref 70–99)
Potassium: 4.1 mmol/L (ref 3.5–5.1)
Sodium: 140 mmol/L (ref 135–145)

## 2020-06-25 MED ORDER — ONDANSETRON 4 MG PO TBDP: 4.0000 mg | ORAL_TABLET | Freq: Three times a day (TID) | ORAL | 0 refills | Status: DC | PRN

## 2020-06-25 MED ORDER — TAMSULOSIN HCL 0.4 MG PO CAPS: 0.4000 mg | ORAL_CAPSULE | Freq: Every day | ORAL | 0 refills | Status: DC

## 2020-06-25 MED ORDER — OXYCODONE-ACETAMINOPHEN 7.5-325 MG PO TABS: 1.0000 | ORAL_TABLET | Freq: Four times a day (QID) | ORAL | 0 refills | Status: DC | PRN

## 2020-06-25 MED ORDER — MORPHINE SULFATE (PF) 4 MG/ML IV SOLN
4.0000 mg | Freq: Once | INTRAVENOUS | Status: AC
Start: 2020-06-25 — End: 2020-06-25
  Administered 2020-06-25: 4 mg via INTRAMUSCULAR
  Filled 2020-06-25: qty 1

## 2020-06-25 NOTE — ED Notes (Signed)
See triage note  Presents with right flank pain and urinary sx's  States discomfort moved into right side then pressure in bladder   States then pain stopped  States pain returned this am

## 2020-06-25 NOTE — ED Triage Notes (Addendum)
First Nurse Note:  C/O right flank pain.  States presented to ED last night for same, but LWBS.  AAOx3.  Skin pale.  Ambulates with easy and steady gait.

## 2020-06-25 NOTE — ED Notes (Signed)
Pt sitting on bench  Doubled over  States pain is returning

## 2020-06-25 NOTE — Discharge Instructions (Signed)
Follow-up with Dr. Lonna Cobb if any continued problems or concerns.  Increase fluids especially water.  Take medication only as directed.  Zofran as needed for nausea or vomiting.  Flomax is 1 tablet every day to help you pass the stone and Percocet every 6 hours if needed for severe pain.

## 2020-06-25 NOTE — ED Provider Notes (Signed)
[facility] Emergency Department Provider Note   ____________________________________________   Event Date/Time   First MD Initiated Contact with Patient 06/25/20 1321     (approximate)  I have reviewed the triage vital signs and the nursing notes.   HISTORY  Chief Complaint Flank Pain    HPI Douglas Leonard is a 40 y.o. male presents to the ED with complaint of right flank pain and urinary symptoms that began yesterday.  Patient states that last evening he began having some pressure and frequency.  He then developed some right flank pain but denies any nausea or vomiting.  Patient states that he came to the ED but had to leave due to long wait.  Patient states that symptoms began again today.  He denies any previous history of stones.  He denies any fever or chills.  He rates his pain as a 10/10.       History reviewed. No pertinent past medical history.  There are no problems to display for this patient.   Past Surgical History:  Procedure Laterality Date  . SHOULDER SURGERY      Prior to Admission medications   Medication Sig Start Date End Date Taking? Authorizing Provider  ondansetron (ZOFRAN ODT) 4 MG disintegrating tablet Take 1 tablet (4 mg total) by mouth every 8 (eight) hours as needed for nausea or vomiting. 06/25/20  Yes Tommi Rumps, PA-C  oxyCODONE-acetaminophen (PERCOCET) 7.5-325 MG tablet Take 1 tablet by mouth every 6 (six) hours as needed for severe pain. 06/25/20 06/25/21 Yes Tommi Rumps, PA-C  tamsulosin (FLOMAX) 0.4 MG CAPS capsule Take 1 capsule (0.4 mg total) by mouth daily. 06/25/20  Yes Bridget Hartshorn L, PA-C  acetic acid (VOSOL) 2 % otic solution Place 4 drops into both ears 3 (three) times daily. 12/03/16   Menshew, Charlesetta Ivory, PA-C  fluticasone (FLONASE) 50 MCG/ACT nasal spray Place 1 spray into both nostrils daily. 08/15/16 08/15/17  Rebecka Apley, MD  ibuprofen (ADVIL) 600 MG tablet Take 1 tablet (600 mg total)  by mouth every 8 (eight) hours as needed. 05/26/20   Joni Reining, PA-C    Allergies Oxycodone, Percocet [oxycodone-acetaminophen], Valium [diazepam], and Vicodin [hydrocodone-acetaminophen]  History reviewed. No pertinent family history.  Social History Social History   Tobacco Use  . Smoking status: Never Smoker  . Smokeless tobacco: Never Used  Substance Use Topics  . Alcohol use: No    Comment: Occ  . Drug use: No    Review of Systems Constitutional: No fever/chills Eyes: No visual changes. Cardiovascular: Denies chest pain. Respiratory: Denies shortness of breath. Gastrointestinal: No abdominal pain.  No nausea, no vomiting.  No diarrhea.   Genitourinary: Positive dysuria.  Positive right flank pain. Musculoskeletal: Negative for musculoskeletal pain. Skin: Negative for rash. Neurological: Negative for headaches, focal weakness or numbness. ____________________________________________   PHYSICAL EXAM:  VITAL SIGNS: ED Triage Vitals [06/25/20 1253]  Enc Vitals Group     BP (!) 139/96     Pulse Rate 87     Resp 16     Temp 97.9 F (36.6 C)     Temp Source Oral     SpO2 97 %     Weight 200 lb (90.7 kg)     Height 6' (1.829 m)     Head Circumference      Peak Flow      Pain Score 5     Pain Loc      Pain Edu?  Excl. in GC?     Constitutional: Alert and oriented. Well appearing and in no acute distress, but uncomfortable at times. Eyes: Conjunctivae are normal.  Head: Atraumatic. Neck: No stridor.   Cardiovascular: Normal rate, regular rhythm. Grossly normal heart sounds.  Good peripheral circulation. Respiratory: Normal respiratory effort.  No retractions. Lungs CTAB. Gastrointestinal: Soft and nontender. No distention.  Musculoskeletal: Patient is ambulatory without any assistance. Neurologic:  Normal speech and language. No gross focal neurologic deficits are appreciated. No gait instability. Skin:  Skin is warm, dry and intact. No rash  noted. Psychiatric: Mood and affect are normal. Speech and behavior are normal.  ____________________________________________   LABS (all labs ordered are listed, but only abnormal results are displayed)  Labs Reviewed  URINALYSIS, COMPLETE (UACMP) WITH MICROSCOPIC - Abnormal; Notable for the following components:      Result Value   Color, Urine YELLOW (*)    APPearance CLEAR (*)    Hgb urine dipstick SMALL (*)    Ketones, ur 20 (*)    All other components within normal limits  BASIC METABOLIC PANEL  CBC   ____________________________________________   RADIOLOGY Beaulah Corin, personally viewed and evaluated these images (plain radiographs) as part of my medical decision making, as well as reviewing the written report by the radiologist.   Official radiology report(s): CT Renal Stone Study  Result Date: 06/25/2020 CLINICAL DATA:  Hematuria, RIGHT flank pain, urinary frequency EXAM: CT ABDOMEN AND PELVIS WITHOUT CONTRAST TECHNIQUE: Multidetector CT imaging of the abdomen and pelvis was performed following the standard protocol without IV contrast. Sagittal and coronal MPR images reconstructed from axial data set. Oral contrast not administered for this indication. COMPARISON:  None FINDINGS: Lower chest: Lung bases clear Hepatobiliary: Gallbladder and liver normal appearance Pancreas: Normal appearance Spleen: Normal appearance Adrenals/Urinary Tract: Adrenal glands and LEFT kidney normal mild RIGHT hydronephrosis and hydroureter secondary to a 1-2 mm diameter distal RIGHT ureteral calculus image 76 just above ureterovesical junction. Mild RIGHT perinephric edema. No renal masses seen. Bladder decompressed. Stomach/Bowel: Normal appendix. Stomach and bowel loops normal appearance. Vascular/Lymphatic: Aorta normal caliber. Few pelvic phleboliths. No adenopathy. Reproductive: Unremarkable prostate gland and seminal vesicles. Other: Umbilical hernia containing fat. No free air or free  fluid. No inflammatory process. Musculoskeletal: Unremarkable IMPRESSION: Mild RIGHT hydronephrosis and hydroureter secondary to a 1-2 mm diameter distal RIGHT ureteral calculus just above ureterovesical junction. Umbilical hernia containing fat. Electronically Signed   By: Ulyses Southward M.D.   On: 06/25/2020 15:07    ____________________________________________   PROCEDURES  Procedure(s) performed (including Critical Care):  Procedures   ____________________________________________   INITIAL IMPRESSION / ASSESSMENT AND PLAN / ED COURSE  As part of my medical decision making, I reviewed the following data within the electronic MEDICAL RECORD NUMBER Notes from prior ED visits and [place] Controlled Substance Database  40 year old male presents to the ED with complaint of right flank pain that began last evening and worsened early this morning.  Patient also has a pressure-like sensation to urinate.  He denies any previous history of stones or UTIs.  Patient was given morphine IM while in the ED after he states that he listed the narcotics as allergies because he did not want to get addicted to any pain medications.  Patient has in the past taken morphine without any adverse reactions.  CT shows a stone 1 to 2 mm on the right.  Patient was made aware.  He was discharged with prescription for Zofran, Percocet and Flomax.  He is encouraged to drink fluids and if any continued problems follow-up with Dr. Lonna Cobb who is on-call for urology.  ____________________________________________   FINAL CLINICAL IMPRESSION(S) / ED DIAGNOSES  Final diagnoses:  Ureterolithiasis  Right flank pain     ED Discharge Orders         Ordered    oxyCODONE-acetaminophen (PERCOCET) 7.5-325 MG tablet  Every 6 hours PRN        06/25/20 1530    tamsulosin (FLOMAX) 0.4 MG CAPS capsule  Daily        06/25/20 1530    ondansetron (ZOFRAN ODT) 4 MG disintegrating tablet  Every 8 hours PRN        06/25/20 1530           *Please note:  Douglas Leonard was evaluated in Emergency Department on 06/25/2020 for the symptoms described in the history of present illness. He was evaluated in the context of the global COVID-19 pandemic, which necessitated consideration that the patient might be at risk for infection with the SARS-CoV-2 virus that causes COVID-19. Institutional protocols and algorithms that pertain to the evaluation of patients at risk for COVID-19 are in a state of rapid change based on information released by regulatory bodies including the CDC and federal and state organizations. These policies and algorithms were followed during the patient's care in the ED.  Some ED evaluations and interventions may be delayed as a result of limited staffing during and the pandemic.*   Note:  This document was prepared using Dragon voice recognition software and may include unintentional dictation errors.    Tommi Rumps, PA-C 06/25/20 1546    Jene Every, MD 06/26/20 [phone]

## 2020-07-01 ENCOUNTER — Telehealth: Payer: Self-pay | Admitting: Urology

## 2020-07-01 NOTE — Telephone Encounter (Signed)
Called patient to schedule He declined stated he past stone on 06/27/20   Douglas Leonard

## 2020-07-01 NOTE — Telephone Encounter (Signed)
-----   Message from Riki Altes, MD sent at 07/01/2020  9:12 AM EST ----- Regarding: Appointment Recently seen in ED for stone.  Please schedule follow-up appointment with me.  Needs KUB prior

## 2020-12-29 ENCOUNTER — Other Ambulatory Visit: Payer: Self-pay

## 2020-12-29 ENCOUNTER — Encounter: Payer: Self-pay | Admitting: Emergency Medicine

## 2020-12-29 ENCOUNTER — Emergency Department: Payer: Worker's Compensation

## 2020-12-29 ENCOUNTER — Emergency Department
Admission: EM | Admit: 2020-12-29 | Discharge: 2020-12-29 | Disposition: A | Discharge status: Home / Self Care | Payer: Worker's Compensation | Attending: Emergency Medicine | Admitting: Emergency Medicine

## 2020-12-29 DIAGNOSIS — X500XXA Overexertion from strenuous movement or load, initial encounter: Secondary | ICD-10-CM | POA: Insufficient documentation

## 2020-12-29 DIAGNOSIS — Y990 Civilian activity done for income or pay: Secondary | ICD-10-CM | POA: Insufficient documentation

## 2020-12-29 DIAGNOSIS — M5441 Lumbago with sciatica, right side: Secondary | ICD-10-CM | POA: Diagnosis not present

## 2020-12-29 DIAGNOSIS — M545 Low back pain, unspecified: Secondary | ICD-10-CM | POA: Diagnosis present

## 2020-12-29 MED ORDER — MELOXICAM 15 MG PO TABS: 15.0000 mg | ORAL_TABLET | Freq: Every day | ORAL | 2 refills | Status: DC

## 2020-12-29 MED ORDER — BACLOFEN 10 MG PO TABS: 10.0000 mg | ORAL_TABLET | Freq: Three times a day (TID) | ORAL | 0 refills | Status: AC

## 2020-12-29 NOTE — Discharge Instructions (Addendum)
Follow-up with your regular doctor as needed Follow-up with orthopedics if not improving in 1 week, make sure that you have the physician approved by Worker's Comp. she did not end up paying the entire bill Apply ice to the lower back Take medication as prescribed

## 2020-12-29 NOTE — ED Triage Notes (Signed)
C/o injuring back last night at work. C/O low back pain.

## 2020-12-29 NOTE — ED Notes (Signed)
See triage note  Presents with lower back pain  States he was trying to lifting something last pm at work  Thinks he may have twisted wrong

## 2020-12-29 NOTE — ED Provider Notes (Signed)
[facility] Emergency Department Provider Note  ____________________________________________   Event Date/Time   First MD Initiated Contact with Patient 12/29/20 1317     (approximate)  I have reviewed the triage vital signs and the nursing notes.   HISTORY  Chief Complaint Back Pain    HPI Nicolus Ose is a 40 y.o. 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 No pertinent past medical history.  There are no problems to display for this patient.   Past Surgical History:  Procedure Laterality Date   SHOULDER SURGERY      Prior to Admission medications   Medication Sig Start Date End Date Taking? Authorizing Provider  baclofen (LIORESAL) 10 MG tablet Take 1 tablet (10 mg total) by mouth 3 (three) times daily for 7 days. 12/29/20 01/05/21 Yes Danniel Tones, Roselyn Bering, PA-C  meloxicam (MOBIC) 15 MG tablet Take 1 tablet (15 mg total) by mouth daily. 12/29/20 12/29/21 Yes Edvardo Honse, Roselyn Bering, PA-C  acetic acid (VOSOL) 2 % otic solution Place 4 drops into both ears 3 (three) times daily. 12/03/16   Menshew, Charlesetta Ivory, PA-C  fluticasone (FLONASE) 50 MCG/ACT nasal spray Place 1 spray into both nostrils daily. 08/15/16 08/15/17  Rebecka Apley, MD  ibuprofen (ADVIL) 600 MG tablet Take 1 tablet (600 mg total) by mouth every 8 (eight) hours as needed. 05/26/20   Joni Reining, PA-C  tamsulosin (FLOMAX) 0.4 MG CAPS capsule Take 1 capsule (0.4 mg total) by mouth daily. 06/25/20   Tommi Rumps, PA-C    Allergies Oxycodone, Percocet  [oxycodone-acetaminophen], Valium [diazepam], and Vicodin [hydrocodone-acetaminophen]  No family history on file.  Social History Social History   Tobacco Use   Smoking status: Never   Smokeless tobacco: Never  Substance Use Topics   Alcohol use: No    Comment: Occ   Drug use: No    Review of Systems  Constitutional: No fever/chills Eyes: No visual changes. ENT: No sore throat. Respiratory: Denies cough Genitourinary: Negative for dysuria. Musculoskeletal: Positive for back pain. Skin: Negative for rash.    ____________________________________________   PHYSICAL EXAM:  VITAL SIGNS: ED Triage Vitals  Enc Vitals Group     BP 12/29/20 1155 123/81     Pulse Rate 12/29/20 1155 88     Resp 12/29/20 1155 20     Temp 12/29/20 1155 (!) 97.5 F (36.4 C)     Temp Source 12/29/20 1155 Oral     SpO2 12/29/20 1155 96 %     Weight 12/29/20 1151 199 lb 15.3 oz (90.7 kg)     Height 12/29/20 1151 6' (1.829 m)     Head Circumference --      Peak Flow --      Pain Score 12/29/20 1151 7     Pain Loc --      Pain Edu? --      Excl. in GC? --     Constitutional: Alert and oriented. Well appearing and in no acute distress. Eyes: Conjunctivae are normal.  Head: Atraumatic. Nose: No congestion/rhinnorhea. Mouth/Throat: Mucous membranes are moist.   Neck:  supple no lymphadenopathy noted Cardiovascular: Normal rate, regular rhythm. Heart sounds are normal Respiratory: Normal respiratory effort.  No retractions, lungs c t a  Abd: soft nontender bs normal all 4 quad GU: deferred Musculoskeletal: FROM all extremities, warm and well perfused.  Decreased rom of back due to discomfort, lumbar spine tender, spasm noted along the lumbar spine at the paravertebral muscles to the right, negative slr, 5/5 strength in great toes b/l, 5/5 strength in lower legs, n/v intact Neurologic:  Normal speech and language.  Skin:  Skin is warm, dry and intact. No rash noted. Psychiatric: Mood and  affect are normal. Speech and behavior are normal.  ____________________________________________   LABS (all labs ordered are listed, but only abnormal results are displayed)  Labs Reviewed - No data to display ____________________________________________   ____________________________________________  RADIOLOGY  Lumbar spine  ____________________________________________   PROCEDURES  Procedure(s) performed: No  Procedures    ____________________________________________   INITIAL IMPRESSION / ASSESSMENT AND PLAN / ED COURSE  Pertinent labs & imaging results that were available during my care of the patient were reviewed by me and considered in my medical decision making (see chart for details).   The patient is 40 year old male presents to the emergency department Worker's Comp. injury of his lower back.  See HPI.  Physical exam shows patient appears stable.  X-ray of the lumbar spine reviewed by me confirmed by radiology to be negative for any acute abnormalities  I did explain the findings to the patient.  We will treat with conservative measures, he will be started on meloxicam and baclofen.  He is to apply ice and wet heat to the lower back.  Follow-up with orthopedics if not improving in 1 week.  He is to have the orthopedic doctor approved by his Worker's Comp. insurance he is not end up having to pay the entire bill.  Is discharged in stable condition.  Worker's Comp. restrictions were put on a work note for him to turn into his employer.     As part of my medical decision making, I reviewed the following data within the electronic MEDICAL RECORD NUMBER Nursing notes reviewed and incorporated, Old chart reviewed, Radiograph reviewed , Notes from prior ED visits, and [place] Controlled Substance Database  ____________________________________________   FINAL CLINICAL IMPRESSION(S) / ED DIAGNOSES  Final diagnoses:  Acute midline low back pain with right-sided sciatica       NEW MEDICATIONS STARTED DURING THIS VISIT:  New Prescriptions   BACLOFEN (LIORESAL) 10 MG TABLET    Take 1 tablet (10 mg total) by mouth 3 (three) times daily for 7 days.   MELOXICAM (MOBIC) 15 MG TABLET    Take 1 tablet (15 mg total) by mouth daily.     Note:  This document was prepared using Dragon voice recognition software and may include unintentional dictation errors.     Faythe Ghee, PA-C 12/29/20 1456    Gilles Chiquito, MD 12/29/20 [phone]

## 2021-01-16 ENCOUNTER — Emergency Department
Admission: EM | Admit: 2021-01-16 | Discharge: 2021-01-16 | Disposition: A | Discharge status: Home / Self Care | Payer: Self-pay | Attending: Emergency Medicine | Admitting: Emergency Medicine

## 2021-01-16 ENCOUNTER — Other Ambulatory Visit: Payer: Self-pay

## 2021-01-16 DIAGNOSIS — X58XXXA Exposure to other specified factors, initial encounter: Secondary | ICD-10-CM | POA: Insufficient documentation

## 2021-01-16 DIAGNOSIS — K047 Periapical abscess without sinus: Secondary | ICD-10-CM | POA: Insufficient documentation

## 2021-01-16 DIAGNOSIS — S025XXA Fracture of tooth (traumatic), initial encounter for closed fracture: Secondary | ICD-10-CM | POA: Insufficient documentation

## 2021-01-16 MED ORDER — AMOXICILLIN 500 MG PO CAPS
1000.0000 mg | ORAL_CAPSULE | Freq: Once | ORAL | Status: AC
  Administered 2021-01-16: 1000 mg via ORAL
  Filled 2021-01-16: qty 2

## 2021-01-16 MED ORDER — AMOXICILLIN 875 MG PO TABS: 875.0000 mg | ORAL_TABLET | Freq: Two times a day (BID) | ORAL | 0 refills | Status: DC

## 2021-01-16 MED ORDER — LIDOCAINE VISCOUS HCL 2 % MT SOLN
15.0000 mL | Freq: Once | OROMUCOSAL | Status: AC
  Administered 2021-01-16: 15 mL via OROMUCOSAL
  Filled 2021-01-16: qty 15

## 2021-01-16 MED ORDER — MAGIC MOUTHWASH W/LIDOCAINE: 5.0000 mL | Freq: Four times a day (QID) | ORAL | 0 refills | Status: DC

## 2021-01-16 NOTE — ED Provider Notes (Signed)
[facility] Emergency Department Provider Note  ____________________________________________  Time seen: Approximately 8:30 PM  I have reviewed the triage vital signs and the nursing notes.   HISTORY  Chief Complaint Dental Pain    HPI Douglas Leonard is a 40 y.o. male who presents the emergency department for evaluation of left dental pain/injury.  Patient had a tooth that needed a root canal, states that his dental insurance had been maxed out this year already.  He was trying to wait till the new year to take care of this tooth.  This fractured last week, he has developed increasing pain and some swelling in the area.  He is now having pain along the trigeminal nerve distribution.  No fevers or chills, difficulty breathing or swallowing.       No past medical history on file.  There are no problems to display for this patient.   Past Surgical History:  Procedure Laterality Date   SHOULDER SURGERY      Prior to Admission medications   Medication Sig Start Date End Date Taking? Authorizing Provider  amoxicillin (AMOXIL) 875 MG tablet Take 1 tablet (875 mg total) by mouth 2 (two) times daily. 01/16/21  Yes Ilir Mahrt, Delorise Royals, PA-C  magic mouthwash w/lidocaine SOLN Take 5 mLs by mouth 4 (four) times daily. 01/16/21  Yes Emiko Osorto, Delorise Royals, PA-C  acetic acid (VOSOL) 2 % otic solution Place 4 drops into both ears 3 (three) times daily. 12/03/16   Menshew, Charlesetta Ivory, PA-C  fluticasone (FLONASE) 50 MCG/ACT nasal spray Place 1 spray into both nostrils daily. 08/15/16 08/15/17  Rebecka Apley, MD  ibuprofen (ADVIL) 600 MG tablet Take 1 tablet (600 mg total) by mouth every 8 (eight) hours as needed. 05/26/20   Joni Reining, PA-C  meloxicam (MOBIC) 15 MG tablet Take 1 tablet (15 mg total) by mouth daily. 12/29/20 12/29/21  Fisher, Roselyn Bering, PA-C  tamsulosin (FLOMAX) 0.4 MG CAPS capsule Take 1 capsule (0.4 mg total) by mouth daily. 06/25/20   Tommi Rumps,  PA-C    Allergies Oxycodone, Percocet [oxycodone-acetaminophen], Valium [diazepam], and Vicodin [hydrocodone-acetaminophen]  No family history on file.  Social History Social History   Tobacco Use   Smoking status: Never   Smokeless tobacco: Never  Substance Use Topics   Alcohol use: No    Comment: Occ   Drug use: No     Review of Systems  Constitutional: No fever/chills Eyes: No visual changes. No discharge ENT: Left-sided dental pain Cardiovascular: no chest pain. Respiratory: no cough. No SOB. Gastrointestinal: No abdominal pain.  No nausea, no vomiting.  No diarrhea.  No constipation. Musculoskeletal: Negative for musculoskeletal pain. Skin: Negative for rash, abrasions, lacerations, ecchymosis. Neurological: Negative for headaches, focal weakness or numbness.  10 System ROS otherwise negative.  ____________________________________________   PHYSICAL EXAM:  VITAL SIGNS: ED Triage Vitals  Enc Vitals Group     BP 01/16/21 1940 (!) 143/95     Pulse Rate 01/16/21 1940 70     Resp 01/16/21 1940 18     Temp 01/16/21 1940 98.3 F (36.8 C)     Temp Source 01/16/21 1940 Oral     SpO2 01/16/21 1940 97 %     Weight 01/16/21 1942 195 lb (88.5 kg)     Height 01/16/21 1942 6' (1.829 m)     Head Circumference --      Peak Flow --      Pain Score 01/16/21 1941 5  Pain Loc --      Pain Edu? --      Excl. in GC? --      Constitutional: Alert and oriented. Well appearing and in no acute distress. Eyes: Conjunctivae are normal. PERRL. EOMI. Head: Atraumatic. ENT:      Ears:       Nose: No congestion/rhinnorhea.      Mouth/Throat: Mucous membranes are moist.  Visualization of the oral cavity reveals fractured tooth in the left lower dentition, this is the second molar lower dentition.  There is some mild surrounding erythema and edema.  No fluctuance along the gumline for superficial abscess.  Palpation does reveal tenderness along the mandible there is no  appreciable soft tissue findings in the mandible or submandibular region. Neck: No stridor.    Cardiovascular: Normal rate, regular rhythm. Normal S1 and S2.  Good peripheral circulation. Respiratory: Normal respiratory effort without tachypnea or retractions. Lungs CTAB. Good air entry to the bases with no decreased or absent breath sounds. Musculoskeletal: Full range of motion to all extremities. No gross deformities appreciated. Neurologic:  Normal speech and language. No gross focal neurologic deficits are appreciated.  Skin:  Skin is warm, dry and intact. No rash noted. Psychiatric: Mood and affect are normal. Speech and behavior are normal. Patient exhibits appropriate insight and judgement.   ____________________________________________   LABS (all labs ordered are listed, but only abnormal results are displayed)  Labs Reviewed - No data to display ____________________________________________  EKG   ____________________________________________  RADIOLOGY   No results found.  ____________________________________________    PROCEDURES  Procedure(s) performed:    Procedures    Medications  amoxicillin (AMOXIL) capsule 1,000 mg (has no administration in time range)  lidocaine (XYLOCAINE) 2 % viscous mouth solution 15 mL (has no administration in time range)     ____________________________________________   INITIAL IMPRESSION / ASSESSMENT AND PLAN / ED COURSE  Pertinent labs & imaging results that were available during my care of the patient were reviewed by me and considered in my medical decision making (see chart for details).  Review of the [place] CSRS was performed in accordance of the [facility] prior to dispensing any controlled drugs.           Patient's diagnosis is consistent with dental infection, dental fracture.  Patient presents emergency department after breaking one of his teeth.  Patient now has findings concerning for dental infection.  He  scheduled to see his dentist for definitive treatment in 5 days.  Patiently placed on antibiotic and Magic mouthwash for symptom relief.  Follow-up with dentist in 5 days. Patient is given ED precautions to return to the ED for any worsening or new symptoms.     ____________________________________________  FINAL CLINICAL IMPRESSION(S) / ED DIAGNOSES  Final diagnoses:  Closed fracture of tooth, initial encounter  Dental infection      NEW MEDICATIONS STARTED DURING THIS VISIT:  ED Discharge Orders          Ordered    amoxicillin (AMOXIL) 875 MG tablet  2 times daily        01/16/21 2121    magic mouthwash w/lidocaine SOLN  4 times daily       Note to Pharmacy: Dispense in a 1/1/1 ratio. Use lidocaine, diphenhydramine, prednisolone   01/16/21 2121                This chart was dictated using voice recognition software/Dragon. Despite best efforts to proofread, errors can occur which can change  the meaning. Any change was purely unintentional.    Racheal Patches, PA-C 01/16/21 2121    Shaune Pollack, MD 01/17/21 [phone]

## 2021-01-16 NOTE — ED Triage Notes (Signed)
Patient reports dental pain to left lower molar after tooth broke.  States unable to get into dentist until Friday.

## 2021-03-06 ENCOUNTER — Encounter: Payer: Self-pay | Admitting: Emergency Medicine

## 2021-03-06 ENCOUNTER — Other Ambulatory Visit: Payer: Self-pay

## 2021-03-06 ENCOUNTER — Emergency Department
Admission: EM | Admit: 2021-03-06 | Discharge: 2021-03-06 | Disposition: A | Discharge status: Home / Self Care | Payer: Self-pay | Attending: Emergency Medicine | Admitting: Emergency Medicine

## 2021-03-06 DIAGNOSIS — H1031 Unspecified acute conjunctivitis, right eye: Secondary | ICD-10-CM

## 2021-03-06 DIAGNOSIS — H60502 Unspecified acute noninfective otitis externa, left ear: Secondary | ICD-10-CM | POA: Diagnosis not present

## 2021-03-06 DIAGNOSIS — J029 Acute pharyngitis, unspecified: Secondary | ICD-10-CM | POA: Insufficient documentation

## 2021-03-06 MED ORDER — NEOMYCIN-POLYMYXIN-HC 3.5-10000-1 OT SOLN: 4.0000 [drp] | Freq: Four times a day (QID) | OTIC | 0 refills | Status: AC

## 2021-03-06 MED ORDER — FLUORESCEIN SODIUM 1 MG OP STRP
1.0000 | ORAL_STRIP | Freq: Once | OPHTHALMIC | Status: DC
  Filled 2021-03-06: qty 1

## 2021-03-06 MED ORDER — TETRACAINE HCL 0.5 % OP SOLN
1.0000 [drp] | Freq: Once | OPHTHALMIC | Status: DC
  Filled 2021-03-06: qty 4

## 2021-03-06 MED ORDER — TOBRAMYCIN 0.3 % OP SOLN: 2.0000 [drp] | OPHTHALMIC | 0 refills | Status: DC

## 2021-03-06 NOTE — ED Provider Notes (Signed)
[facility] Emergency Department Provider Note   ____________________________________________   Event Date/Time   First MD Initiated Contact with Patient 03/06/21 1004     (approximate)  I have reviewed the triage vital signs and the nursing notes.   HISTORY  Chief Complaint Sore Throat    HPI Douglas Leonard is a 40 y.o. male presents to the ED with complaint of cough and sore throat and telling the nurses that his daughter had the flu last week.  He is feeling much better than at this time.  His complaint for which she wants to be seen is his left ear is hurting along with some irritation to his right eye.  He states that he wears contacts which are not extended-wear but sleeps in them and wears them for several weeks at a time without taking them out.  He denies any photophobia or foreign body sensation with blinking.  He denies any visual changes.  Left ear is painful to touch but no drainage is noted and patient denies any fever or chills.      History reviewed. No pertinent past medical history.  There are no problems to display for this patient.   Past Surgical History:  Procedure Laterality Date   SHOULDER SURGERY      Prior to Admission medications   Medication Sig Start Date End Date Taking? Authorizing Provider  neomycin-polymyxin-hydrocortisone (CORTISPORIN) OTIC solution Place 4 drops into the left ear 4 (four) times daily for 10 days. 03/06/21 03/16/21 Yes Kimmie Berggren L, PA-C  tobramycin (TOBREX) 0.3 % ophthalmic solution Place 2 drops into the right eye every 4 (four) hours. 03/06/21  Yes Bridget Hartshorn L, PA-C  acetic acid (VOSOL) 2 % otic solution Place 4 drops into both ears 3 (three) times daily. 12/03/16   Menshew, Charlesetta Ivory, PA-C  fluticasone (FLONASE) 50 MCG/ACT nasal spray Place 1 spray into both nostrils daily. 08/15/16 08/15/17  Rebecka Apley, MD  magic mouthwash w/lidocaine SOLN Take 5 mLs by mouth 4 (four) times  daily. 01/16/21   Cuthriell, Delorise Royals, PA-C    Allergies Oxycodone, Percocet [oxycodone-acetaminophen], Valium [diazepam], and Vicodin [hydrocodone-acetaminophen]  No family history on file.  Social History Social History   Tobacco Use   Smoking status: Never   Smokeless tobacco: Never  Substance Use Topics   Alcohol use: No    Comment: Occ   Drug use: No    Review of Systems Constitutional: No fever/chills Eyes: No visual changes.  Occasional discharge from the right eye.  No photophobia noted. ENT: No sore throat.  Positive for left ear pain. Cardiovascular: Denies chest pain. Respiratory: Denies shortness of breath. Gastrointestinal: No abdominal pain.  No nausea, no vomiting.  Musculoskeletal: Negative for back pain. Skin: Negative for rash. Neurological: Negative for headaches, focal weakness or numbness. ____________________________________________   PHYSICAL EXAM:  VITAL SIGNS: ED Triage Vitals  Enc Vitals Group     BP 03/06/21 0921 130/89     Pulse Rate 03/06/21 0921 92     Resp 03/06/21 0921 15     Temp 03/06/21 0921 98.3 F (36.8 C)     Temp Source 03/06/21 0921 Oral     SpO2 03/06/21 0921 96 %     Weight 03/06/21 0912 190 lb (86.2 kg)     Height 03/06/21 0912 6' (1.829 m)     Head Circumference --      Peak Flow --      Pain Score 03/06/21 0911 0  Pain Loc --      Pain Edu? --      Excl. in GC? --     Constitutional: Alert and oriented. Well appearing and in no acute distress. Eyes: Left conjunctive a is clear.  Tetracaine was applied to the right eye.  Irritation is noted.  Upper lid was everted no foreign body was present.  No corneal abrasion was appreciated at the time of exam with fluorescein stain.  PERRL. EOMI. no photophobia present. Head: Atraumatic. Nose: No congestion/rhinnorhea. Ears: Right EAC and TM are clear.  Left EAC is extremely tender with speculum exam.  Minimal exudate is present.  TM is clear on the left. Mouth/Throat:  Mucous membranes are moist.  Oropharynx non-erythematous. Neck: No stridor.   Cardiovascular: Normal rate, regular rhythm. Grossly normal heart sounds.  Good peripheral circulation. Respiratory: Normal respiratory effort.  No retractions. Lungs CTAB. Gastrointestinal: Soft and nontender. No distention. No abdominal bruits. No CVA tenderness. Musculoskeletal: No lower extremity tenderness nor edema.   Neurologic:  Normal speech and language. No gross focal neurologic deficits are appreciated. No gait instability. Skin:  Skin is warm, dry and intact. No rash noted. Psychiatric: Mood and affect are normal. Speech and behavior are normal.  ____________________________________________   LABS (all labs ordered are listed, but only abnormal results are displayed)  Labs Reviewed - No data to display  PROCEDURES  Procedure(s) performed (including Critical Care):  Procedures   ____________________________________________   INITIAL IMPRESSION / ASSESSMENT AND PLAN / ED COURSE  As part of my medical decision making, I reviewed the following data within the electronic MEDICAL RECORD NUMBER Notes from prior ED visits and [place] Controlled Substance Database  40 year old male presents to the ED after having had what sounds like the flu during the time that his daughter was sick and diagnosed with flu.  Patient today is here due to irritation to his right eye after wearing his contacts for an extended length of time.  He also complains of left ear pain for which there was some exudate noted in the canal with tenderness.  TM was clear.  Eye exam did not show a corneal abrasion or foreign body.  Patient was given a prescription for Tobrex ophthalmic to use to his right eye and also Cortisporin otic to his left ear.  Patient is encouraged to follow-up with Dr. Brooke Dare who is the ophthalmologist on-call if he continues to have problems with his eye. ____________________________________________   FINAL CLINICAL  IMPRESSION(S) / ED DIAGNOSES  Final diagnoses:  Acute conjunctivitis of right eye, unspecified acute conjunctivitis type  Acute otitis externa of left ear, unspecified type     ED Discharge Orders          Ordered    tobramycin (TOBREX) 0.3 % ophthalmic solution  Every 4 hours        03/06/21 1102    neomycin-polymyxin-hydrocortisone (CORTISPORIN) OTIC solution  4 times daily        03/06/21 1102             Note:  This document was prepared using Dragon voice recognition software and may include unintentional dictation errors.    Tommi Rumps, PA-C 03/06/21 1305    Jene Every, MD 03/06/21 1325

## 2021-03-06 NOTE — ED Triage Notes (Signed)
Pt reports cough and intermittent sore throat for the past week. Pt states his daughter had the flu last week.

## 2021-03-06 NOTE — Discharge Instructions (Signed)
Follow-up with Dr. Brooke Dare if you continue to have problems with your right.  Discontinue wearing your contacts until you are able to blink, free, look at light without any difficulties and then you may continue using your contacts.  2 prescriptions were sent to your pharmacy.  1 is for your left ear the other for your right eye.  You may take Tylenol or ibuprofen as needed for pain.

## 2021-05-11 ENCOUNTER — Emergency Department
Admission: EM | Admit: 2021-05-11 | Discharge: 2021-05-11 | Disposition: A | Discharge status: Home / Self Care | Payer: Self-pay | Attending: Emergency Medicine | Admitting: Emergency Medicine

## 2021-05-11 ENCOUNTER — Other Ambulatory Visit: Payer: Self-pay

## 2021-05-11 DIAGNOSIS — H6690 Otitis media, unspecified, unspecified ear: Secondary | ICD-10-CM

## 2021-05-11 DIAGNOSIS — H60502 Unspecified acute noninfective otitis externa, left ear: Secondary | ICD-10-CM | POA: Diagnosis not present

## 2021-05-11 DIAGNOSIS — H6692 Otitis media, unspecified, left ear: Secondary | ICD-10-CM | POA: Diagnosis not present

## 2021-05-11 DIAGNOSIS — H60512 Acute actinic otitis externa, left ear: Secondary | ICD-10-CM | POA: Insufficient documentation

## 2021-05-11 MED ORDER — OFLOXACIN 0.3 % OT SOLN: 10.0000 [drp] | Freq: Every day | OTIC | 0 refills | Status: AC

## 2021-05-11 MED ORDER — AMOXICILLIN-POT CLAVULANATE 875-125 MG PO TABS: 1.0000 | ORAL_TABLET | Freq: Two times a day (BID) | ORAL | 0 refills | Status: AC

## 2021-05-11 NOTE — ED Triage Notes (Signed)
Pt comes with c/o left ear pain. Pt state he woke up and it was hurting.

## 2021-05-11 NOTE — ED Provider Notes (Signed)
[facility] Provider Note    None    (approximate)   History   Otalgia   HPI  Douglas Leonard is a 41 y.o. male with negative past medical history who presents for assessment of left ear pain.  Patient states he woke up with this with a feeling of mild fullness in the left ear and some pressure.  It does not hurt and much Emergency room.  No recent water submersion or trauma other than patient cleaned with a Q-tip over the last couple days.  No right ear symptoms, sore throat, headache, fevers, cough, vomiting, diarrhea, rash or any other clear associated sick symptoms.  No other acute concerns at this time.      Physical Exam  Triage Vital Signs: ED Triage Vitals  Enc Vitals Group     BP      Pulse      Resp      Temp      Temp src      SpO2      Weight      Height      Head Circumference      Peak Flow      Pain Score      Pain Loc      Pain Edu?      Excl. in [place]?     Most recent vital signs: Vitals:   05/11/21 0720  BP: 131/85  Pulse: 89  Resp: 18  Temp: 98.2 F (36.8 C)  SpO2: 100%    General: Awake, no distress.  CV:  Good peripheral perfusion.  Resp:  Normal effort.  Abd:  No distention.  Other:  Cranial nerves II through XII are grossly intact.  Oropharynx is unremarkable.  Right TM and external canal are unremarkable.  Left TM has some erythema and bulging with some purulence noted in external canal as well as some erythema.  No bleeding.  No evidence of perforation.  No surrounding redness or tenderness around the ear or pinna itself.   ED Results / Procedures / Treatments  Labs (all labs ordered are listed, but only abnormal results are displayed) Labs Reviewed - No data to display   EKG  RADIOLOGY    PROCEDURES:    MEDICATIONS ORDERED IN ED: Medications - No data to display   IMPRESSION / MDM / [place] / ED COURSE  I reviewed the triage vital signs and the nursing notes.                               Differential diagnosis includes, but is not limited to mastoiditis, otitis media, otitis externa and perichondritis.  I did review a clinic visit note from 04/22/2020, patient was noted to have no other significant past medical history.  Exam most consistent with otitis media and externa.  No evidence of sepsis orinfection at this time.  Patient is stable for discharge with outpatient follow-up.  Rx for Augmentin and ofloxacin drops written.  Discharged in stable condition.  Strict return precautions advised and discussed.      FINAL CLINICAL IMPRESSION(S) / ED DIAGNOSES   Final diagnoses:  Acute otitis externa of left ear, unspecified type  Acute otitis media, unspecified otitis media type     Rx / DC Orders   ED Discharge Orders          Ordered    amoxicillin-clavulanate (AUGMENTIN) 875-125 MG tablet  2 times  daily        05/11/21 0730    ofloxacin (FLOXIN) 0.3 % OTIC solution  Daily        05/11/21 0732             Note:  This document was prepared using Dragon voice recognition software and may include unintentional dictation errors.   Lucrezia Starch, MD 05/11/21 [phone]

## 2021-08-21 ENCOUNTER — Other Ambulatory Visit: Payer: Self-pay

## 2021-08-21 ENCOUNTER — Emergency Department
Admission: EM | Admit: 2021-08-21 | Discharge: 2021-08-21 | Disposition: A | Discharge status: Home / Self Care | Payer: Self-pay | Attending: Emergency Medicine | Admitting: Emergency Medicine

## 2021-08-21 ENCOUNTER — Encounter: Payer: Self-pay | Admitting: Emergency Medicine

## 2021-08-21 DIAGNOSIS — Y93E2 Activity, laundry: Secondary | ICD-10-CM | POA: Insufficient documentation

## 2021-08-21 DIAGNOSIS — M545 Low back pain, unspecified: Secondary | ICD-10-CM | POA: Insufficient documentation

## 2021-08-21 DIAGNOSIS — X500XXA Overexertion from strenuous movement or load, initial encounter: Secondary | ICD-10-CM | POA: Insufficient documentation

## 2021-08-21 HISTORY — DX: Dorsalgia, unspecified: M54.9

## 2021-08-21 MED ORDER — LIDOCAINE 5 % EX PTCH
1.0000 | MEDICATED_PATCH | CUTANEOUS | Status: DC
  Administered 2021-08-21: 1 via TRANSDERMAL
  Filled 2021-08-21: qty 1

## 2021-08-21 MED ORDER — IBUPROFEN 400 MG PO TABS
400.0000 mg | ORAL_TABLET | Freq: Once | ORAL | Status: AC
  Administered 2021-08-21: 400 mg via ORAL
  Filled 2021-08-21: qty 1

## 2021-08-21 MED ORDER — CYCLOBENZAPRINE HCL 10 MG PO TABS
10.0000 mg | ORAL_TABLET | Freq: Once | ORAL | Status: AC
  Administered 2021-08-21: 10 mg via ORAL
  Filled 2021-08-21: qty 1

## 2021-08-21 MED ORDER — CYCLOBENZAPRINE HCL 10 MG PO TABS: 10.0000 mg | ORAL_TABLET | Freq: Three times a day (TID) | ORAL | 0 refills | Status: AC | PRN

## 2021-08-21 NOTE — ED Notes (Signed)
See triage note  presents with lower back pain  states pain is moving into right buttock area  states he felt a pop when picking up a basket  ?

## 2021-08-21 NOTE — ED Triage Notes (Signed)
Pt reports has a hx of back problems and this am bent over to pick up a laundry basket and felt a pop and now has worsening back pain.  ?

## 2021-08-21 NOTE — ED Provider Notes (Signed)
? ?[facility] ?Provider Note ? ? ? Event Date/Time  ? First MD Initiated Contact with Patient 08/21/21 1058   ?  (approximate) ? ? ?History  ? ?Back Pain ? ? ?HPI ? ?Douglas Leonard is a 41 y.o. male with a past medical history of previous low back pain related to remote injury who presents for evaluation of acute low back pain associate with hearing a "pop" when patient bent over to lift a laundry basket.  States he stood up quickly.  States this occurred nearly prior to arrival.  He did take some Tylenol but no other analgesia.  States he is in a similar place he has had back pain in the past with heavy lifting.  He denies any incontinence, numbness, chronic steroid use, malignancy history, IV drug use or any weakness or numbness in his lower extremities.  No other acute symptoms including fevers, chills, cough, chest pain vomiting or diarrhea. ? ?  ?Past Medical History:  ?Diagnosis Date  ? Back pain   ? ? ? ?Physical Exam  ?Triage Vital Signs: ?ED Triage Vitals  ?Enc Vitals Group  ?   BP 08/21/21 1056 120/80  ?   Pulse Rate 08/21/21 1056 76  ?   Resp 08/21/21 1056 18  ?   Temp 08/21/21 1056 97.8 ?F (36.6 ?C)  ?   Temp Source 08/21/21 1056 Oral  ?   SpO2 08/21/21 1056 97 %  ?   Weight 08/21/21 1046 189 lb 9.5 oz (86 kg)  ?   Height 08/21/21 1046 6' (1.829 m)  ?   Head Circumference --   ?   Peak Flow --   ?   Pain Score 08/21/21 1046 6  ?   Pain Loc --   ?   Pain Edu? --   ?   Excl. in GC? --   ? ? ?Most recent vital signs: ?Vitals:  ? 08/21/21 1056  ?BP: 120/80  ?Pulse: 76  ?Resp: 18  ?Temp: 97.8 ?F (36.6 ?C)  ?SpO2: 97%  ? ? ?General: Awake, no distress.  ?CV:  Good peripheral perfusion.  ?Resp:  Normal effort.  ?Abd:  No distention.  ?Other:  Patient has symmetric full intact strength of the bilateral lower extremities.  Sensation is intact light touch in all extremities.  2+ bilateral pedal pulses.  There is no midline tenderness over the T or L-spine there is some bilateral paralumbar  spinal tenderness. ? ? ?ED Results / Procedures / Treatments  ?Labs ?(all labs ordered are listed, but only abnormal results are displayed) ?Labs Reviewed - No data to display ? ? ?EKG ? ? ? ?RADIOLOGY ? ? ?PROCEDURES: ? ?Critical Care performed: No ? ?Procedures ? ? ? ?MEDICATIONS ORDERED IN ED: ?Medications  ?lidocaine (LIDODERM) 5 % 1 patch (has no administration in time range)  ?ibuprofen (ADVIL) tablet 400 mg (400 mg Oral Given 08/21/21 1118)  ?cyclobenzaprine (FLEXERIL) tablet 10 mg (10 mg Oral Given 08/21/21 1118)  ? ? ? ?IMPRESSION / MDM / ASSESSMENT AND PLAN / ED COURSE  ?I reviewed the triage vital signs and the nursing notes. ?             ?               ? ?Differential diagnosis includes, but is not limited to disc herniation versus muscle spasm with lower suspicion at this time based on any pain on extension or history exam features to suggest acute infectious process or any  neurologic deficits of stenosis, cord compression or acute infectious process.  Very low suspicion for fracture.  Discussed signs to look out for necessitating immediate return to emergency room including any numbness, incontinence, inability to urinate or stool or any new or worsening of pain.  We will treat with supportive care with NSAIDs lidocaine and Flexeril.  All questions answered to best my ability.  I demonstrated appropriate lifting form and ergonomics.  Discharged in stable condition. ? ?  ? ? ?FINAL CLINICAL IMPRESSION(S) / ED DIAGNOSES  ? ?Final diagnoses:  ?Acute bilateral low back pain, unspecified whether sciatica present  ? ? ? ?Rx / DC Orders  ? ?ED Discharge Orders   ? ?      Ordered  ?  cyclobenzaprine (FLEXERIL) 10 MG tablet  3 times daily PRN       ? 08/21/21 1118  ? ?  ?  ? ?  ? ? ? ?Note:  This document was prepared using Dragon voice recognition software and may include unintentional dictation errors. ?  ?Gilles Chiquito, MD ?08/21/21 1118 ? ?

## 2021-11-05 ENCOUNTER — Emergency Department: Payer: Self-pay

## 2021-11-05 ENCOUNTER — Other Ambulatory Visit: Payer: Self-pay

## 2021-11-05 ENCOUNTER — Emergency Department
Admission: EM | Admit: 2021-11-05 | Discharge: 2021-11-05 | Disposition: A | Discharge status: Home / Self Care | Payer: Self-pay | Attending: Emergency Medicine | Admitting: Emergency Medicine

## 2021-11-05 ENCOUNTER — Encounter: Payer: Self-pay | Admitting: Emergency Medicine

## 2021-11-05 DIAGNOSIS — J40 Bronchitis, not specified as acute or chronic: Secondary | ICD-10-CM

## 2021-11-05 DIAGNOSIS — R002 Palpitations: Secondary | ICD-10-CM | POA: Insufficient documentation

## 2021-11-05 DIAGNOSIS — J42 Unspecified chronic bronchitis: Secondary | ICD-10-CM | POA: Insufficient documentation

## 2021-11-05 DIAGNOSIS — Z20822 Contact with and (suspected) exposure to covid-19: Secondary | ICD-10-CM | POA: Insufficient documentation

## 2021-11-05 LAB — CBC
HCT: 43.3 % (ref 39.0–52.0)
Hemoglobin: 15.1 g/dL (ref 13.0–17.0)
MCH: 30.6 pg (ref 26.0–34.0)
MCHC: 34.9 g/dL (ref 30.0–36.0)
MCV: 87.7 fL (ref 80.0–100.0)
Platelets: 255 10*3/uL (ref 150–400)
RBC: 4.94 MIL/uL (ref 4.22–5.81)
RDW: 11.9 % (ref 11.5–15.5)
WBC: 7.7 10*3/uL (ref 4.0–10.5)
nRBC: 0 % (ref 0.0–0.2)

## 2021-11-05 LAB — RESP PANEL BY RT-PCR (FLU A&B, COVID) ARPGX2
Influenza A by PCR: NEGATIVE
Influenza B by PCR: NEGATIVE
SARS Coronavirus 2 by RT PCR: NEGATIVE

## 2021-11-05 LAB — BASIC METABOLIC PANEL
Anion gap: 6 (ref 5–15)
BUN: 14 mg/dL (ref 6–20)
CO2: 26 mmol/L (ref 22–32)
Calcium: 8.7 mg/dL — ABNORMAL LOW (ref 8.9–10.3)
Chloride: 107 mmol/L (ref 98–111)
Creatinine, Ser: 1.11 mg/dL (ref 0.61–1.24)
GFR, Estimated: >60 mL/min (ref >60)
Glucose, Bld: 135 mg/dL — ABNORMAL HIGH (ref 70–99)
Potassium: 3.8 mmol/L (ref 3.5–5.1)
Sodium: 139 mmol/L (ref 135–145)

## 2021-11-05 LAB — TROPONIN I (HIGH SENSITIVITY): Troponin I (High Sensitivity): 3 ng/L (ref <18)

## 2021-11-05 MED ORDER — DOXYCYCLINE HYCLATE 100 MG PO TABS: 100.0000 mg | ORAL_TABLET | Freq: Two times a day (BID) | ORAL | 0 refills | Status: DC

## 2021-11-05 MED ORDER — DOXYCYCLINE HYCLATE 100 MG PO TABS
100.0000 mg | ORAL_TABLET | Freq: Once | ORAL | Status: AC
  Administered 2021-11-05: 100 mg via ORAL
  Filled 2021-11-05: qty 1

## 2021-11-05 NOTE — ED Notes (Signed)
Pt is pleasant and does not endorse pain currently.

## 2021-11-05 NOTE — Discharge Instructions (Addendum)
Please follow-up with your primary care provider for recheck/reevaluation.  Please take your entire course of antibiotics as prescribed.  Return to the emergency department for any shortness of breath, chest pain or any other symptom personally concerning to yourself.  Please call the number provided to arrange a follow-up appoint with cardiology for consideration of Holter monitoring.

## 2021-11-05 NOTE — ED Notes (Signed)
Cont. Cardiac monitoring initiated. MD at bedside.

## 2021-11-05 NOTE — ED Triage Notes (Signed)
First Nurse Note:  Arrives with c/o intermittent palpitations and difficulty breathing for the past several weeks.  AAOx3.  Skin warm and dry no apparent distress

## 2021-11-05 NOTE — ED Provider Notes (Signed)
[facility] Provider Note    Event Date/Time   First MD Initiated Contact with Patient 11/05/21 1850     (approximate)  History   Chief Complaint: Cough and Shortness of Breath  HPI  Douglas Leonard is a 41 y.o. male with no significant past medical history presents to the emergency department for cough.  According to the patient for the past 6-8 weeks or so he has had a fairly persistent cough.  Occasional white sputum production.  Patient states initially he had low-grade fevers however he has not had a fever over the past few weeks.  Patient denies any chest pain but states he has been experiencing palpitations at times and at times diaphoresis.  Physical Exam   Triage Vital Signs: ED Triage Vitals  Enc Vitals Group     BP 11/05/21 1820 133/90     Pulse Rate 11/05/21 1820 95     Resp 11/05/21 1820 20     Temp 11/05/21 1820 (!) 97.5 F (36.4 C)     Temp Source 11/05/21 1820 Oral     SpO2 11/05/21 1820 98 %     Weight 11/05/21 1823 200 lb (90.7 kg)     Height 11/05/21 1823 6' (1.829 m)     Head Circumference --      Peak Flow --      Pain Score 11/05/21 1823 0     Pain Loc --      Pain Edu? --      Excl. in GC? --     Most recent vital signs: Vitals:   11/05/21 1855 11/05/21 1900  BP:  (!) 138/92  Pulse: 86 83  Resp: 16 13  Temp:    SpO2: 99% 98%    General: Awake, no distress.  CV:  Good peripheral perfusion.  Regular rate and rhythm  Resp:  Normal effort.  Equal breath sounds bilaterally.  Abd:  No distention.  Soft, nontender.  No rebound or guarding.  ED Results / Procedures / Treatments   EKG  EKG viewed and interpreted by myself shows a normal sinus rhythm at 94 bpm with a narrow QRS, normal axis, normal intervals, nonspecific ST changes.  RADIOLOGY  I have reviewed and interpreted the x-ray images.  No acute finding on my evaluation. Radiology is read the chest x-ray is negative   MEDICATIONS ORDERED IN ED: Medications -  No data to display   IMPRESSION / MDM / ASSESSMENT AND PLAN / ED COURSE  I reviewed the triage vital signs and the nursing notes.  Patient's presentation is most consistent with acute presentation with potential threat to life or bodily function.  Patient presents to the emergency department for palpitations 6 to 8 weeks of cough and intermittent shortness of breath.  Overall the patient appears well, no distress.  Physical exam and vitals are reassuring.  Currently satting 100% on room air throughout my evaluation.  Patient's work-up is reassuring including a normal chest x-ray reassuring EKG, normal chemistry CBC and a negative troponin.  Given the patient's prolonged cough chronic bronchitis would be on the differential.  We will also check for COVID/flu/RSV.  However given the patient's reassuring work-up in the emergency department I believe the patient would be safe for discharge home with a trial of outpatient antibiotics such as doxycycline.  Also discussed follow-up with cardiology for a Holter monitor given the patient's complaint of intermittent palpitations.  FINAL CLINICAL IMPRESSION(S) / ED DIAGNOSES   Palpitations Chronic bronchitis  Note:  This document was prepared using Dragon voice recognition software and may include unintentional dictation errors.   Minna Antis, MD 11/05/21 [phone]

## 2021-11-05 NOTE — ED Triage Notes (Signed)
Pt via POV from home. Pt c/o cough for the past 8-9 weeks, states that 2 weeks ago he started having sub sternal CP and SOB. Pt also states he has been having some palpitations, after he had his "coughing spells." Denies any cardiac or lung issues. Pt is A&Ox4 and NAD

## 2022-03-26 ENCOUNTER — Other Ambulatory Visit: Payer: Self-pay

## 2022-03-26 ENCOUNTER — Emergency Department
Admission: EM | Admit: 2022-03-26 | Discharge: 2022-03-26 | Disposition: A | Discharge status: Home / Self Care | Payer: BC Managed Care – PPO | Attending: Emergency Medicine | Admitting: Emergency Medicine

## 2022-03-26 DIAGNOSIS — K029 Dental caries, unspecified: Secondary | ICD-10-CM | POA: Diagnosis not present

## 2022-03-26 DIAGNOSIS — K0889 Other specified disorders of teeth and supporting structures: Secondary | ICD-10-CM | POA: Diagnosis not present

## 2022-03-26 DIAGNOSIS — K0381 Cracked tooth: Secondary | ICD-10-CM | POA: Insufficient documentation

## 2022-03-26 MED ORDER — AMOXICILLIN-POT CLAVULANATE 875-125 MG PO TABS: 1.0000 | ORAL_TABLET | Freq: Two times a day (BID) | ORAL | 0 refills | Status: AC

## 2022-03-26 NOTE — ED Triage Notes (Signed)
Pt reports broke a tooth to his left lower jaw a couple of days ago and now it is painful. Pt states has an appt on Tuesday but the pain is a lot.

## 2022-03-26 NOTE — Discharge Instructions (Signed)
OPTIONS FOR DENTAL FOLLOW UP CARE ° °[place] Department of Health and Human Services - Local Safety Net Dental Clinics °http://www.ncdhhs.gov/dph/oralhealth/services/safetynetclinics.htm °  °[facility] (336-562-3123) ° °[facility] (919-933-9087) ° °[facility] (919-663-1744 ext [phone]) ° ° [facility] ([phone]) ° °[facility] ([phone]) °This clinic caters to the indigent population and is on a lottery system. °Location: °[facility], [address], [place] °Clinic Hours: °Wednesdays from 6pm - 9pm, patients seen by a lottery system. °For dates, call or go to [email or web address]/[facility]/patients/[email or web address]-[facility] °Services: °Cleanings, fillings and simple extractions. °Payment Options: °DENTAL WORK IS FREE OF CHARGE. Bring proof of income or support. °Best way to get seen: °Arrive at 5:15 pm - this is a lottery, NOT first come/first serve, so arriving earlier will not increase your chances of being seen. °  °  °[facility] °[phone] °Select option 1 for emergencies °  °Location: °[facility], [address], [place] °Clinic Hours: °No walk-ins accepted - call the day before to schedule an appointment. °Check in times are 9:30 am and 1:30 pm. °Services: °Simple extractions, temporary fillings, pulpectomy/pulp debridement, uncomplicated abscess drainage. °Payment Options: °PAYMENT IS DUE AT THE TIME OF SERVICE.  Fee is usually $100-200, additional surgical procedures (e.g. abscess drainage) may be extra. °Cash, checks, Visa/MasterCard accepted.  Can file Medicaid if patient is covered for dental - patient should call case worker to check. °No discount for [facility] patients. °Best way to get seen: °MUST call the day before and get onto the schedule. Can usually be seen the next 1-2 days. No walk-ins accepted. °  °  °[facility] °[phone] °   °Location: °[facility], [address], [place] °Clinic Hours: °M, W, Th, F 8am or 1:30pm, Tues 9a or 1:30 - first come/first served. °Services: °Simple extractions, temporary fillings, uncomplicated abscess drainage.  You do not need to be an [place] resident. °Payment Options: °PAYMENT IS DUE AT THE TIME OF SERVICE. °Dental insurance, otherwise sliding scale - bring proof of income or support. °Depending on income and treatment needed, cost is usually $50-200. °Best way to get seen: °Arrive early as it is first come/first served. °  °  °[facility] °[phone] °  °Location: °[address] °Clinic Hours: °Mon-Thu 8a-5p °Services: °Most basic dental services including extractions and fillings. °Payment Options: °PAYMENT IS DUE AT THE TIME OF SERVICE. °Sliding scale, up to 50% off - bring proof if income or support. °Medicaid with dental option accepted. °Best way to get seen: °Call to schedule an appointment, can usually be seen within 2 weeks OR they will try to see walk-ins - show up at 8a or 2p (you may have to wait). °  °  °[facility] °[phone] °[place] RESIDENTS ONLY °  °Location: °[facility], [address], [place], [place] [address] °Clinic Hours: By appointment only. °Monday - Thursday 8am-5pm, Friday 8am-12pm °Services: Cleanings, fillings, extractions. °Payment Options: °PAYMENT IS DUE AT THE TIME OF SERVICE. °Cash, Visa or MasterCard. Sliding scale - $30 minimum per service. °Best way to get seen: °Come in to office, complete packet and make an appointment - need proof of income °or support monies for each household member and proof of [place] residence. °Usually takes about a month to get in. °  °  °[facility] °[phone] °  °Location: °[address]   [place] °Clinic Hours: Walk-in Urgent Care Dental Services are offered Monday-Friday  mornings only. °The numbers of emergencies accepted daily is limited to the number of °providers available. °Maximum 15 - Mondays, Wednesdays & Thursdays °Maximum 10 - Tuesdays & Fridays °Services: °You do not need to be a [place] resident to be seen for a dental emergency. °Emergencies are defined as pain, swelling, abnormal bleeding, or dental trauma. Walkins will receive x-rays if needed. °NOTE: Dental cleaning is not an emergency. °Payment Options: °PAYMENT IS DUE AT THE TIME OF SERVICE. °Minimum co-pay is $40.00 for uninsured patients. °Minimum co-pay is $3.00 for Medicaid with dental coverage. °Dental Insurance is accepted and must be presented at time of visit. °Medicare does not cover dental. °Forms of payment: Cash, credit card, checks. °Best way to get seen: °If not previously registered with the clinic, walk-in dental registration begins at 7:15 am and is on a first come/first serve basis. °If previously registered with the clinic, call to make an appointment. °  °  °[facility] °[phone] °[place] RESIDENTS ONLY °  °Location: °[address], [place], [place] °Clinic Hours: °Mon-Thu 10a-2p °Services: Extractions only! °Payment Options: °FREE (donations accepted) - bring proof of income or support °Best way to get seen: °Call and schedule an appointment OR come at 8am on the 1st Monday of every month (except for holidays) when it is first come/first served. °  °  °[facility] °[phone] °  °Location: °[address], [place] °Clinic Hours: °Friday mornings °Services, Payment Options, Best way to get seen: °Call for info °

## 2022-03-26 NOTE — ED Provider Notes (Signed)
[facility] Provider Note    Event Date/Time   First MD Initiated Contact with Patient 03/26/22 1229     (approximate)   History   Dental Pain   HPI  Douglas Leonard is a 41 y.o. male  here with dental pain. Pt reports that last week he fractured the remainder of a left lower molar he had injured months ago. He has since had initially minimal but now severe aching, throbbing, left lower jaw pain. Pt has had associated pain radiating down his anterior neck. Worse w/ eating, drinking. No alleviating factors. No fevers. No sublingual pain or swelling.       Physical Exam   Triage Vital Signs: ED Triage Vitals  Enc Vitals Group     BP 03/26/22 1140 (!) 127/95     Pulse Rate 03/26/22 1140 72     Resp 03/26/22 1140 20     Temp 03/26/22 1140 98.1 F (36.7 C)     Temp Source 03/26/22 1140 Oral     SpO2 03/26/22 1140 96 %     Weight 03/26/22 1139 200 lb (90.7 kg)     Height 03/26/22 1139 6' (1.829 m)     Head Circumference --      Peak Flow --      Pain Score 03/26/22 1139 7     Pain Loc --      Pain Edu? --      Excl. in GC? --     Most recent vital signs: Vitals:   03/26/22 1140  BP: (!) 127/95  Pulse: 72  Resp: 20  Temp: 98.1 F (36.7 C)  SpO2: 96%     General: Awake, no distress.  CV:  Good peripheral perfusion. RRR. Resp:  Normal effort.  Abd:  No distention.  Other:  Poor dentition diffusely. Left lower molar cracked with exposed, decayed pulp. No surrounding gingival abscess. No sublingual swelling, induration, or firmness. Mild TTP over left submand space but no swelling. Neck is supple.   ED Results / Procedures / Treatments   Labs (all labs ordered are listed, but only abnormal results are displayed) Labs Reviewed - No data to display   EKG    RADIOLOGY    I also independently reviewed and agree with radiologist interpretations.   PROCEDURES:  Critical Care performed: No    MEDICATIONS ORDERED IN  ED: Medications - No data to display   IMPRESSION / MDM / ASSESSMENT AND PLAN / ED COURSE  I reviewed the triage vital signs and the nursing notes.                              Differential diagnosis includes, but is not limited to, dental infection, periapical abscess, neuropathic pain, TMJ, submandibular infection, sialadenitis, sialolith.  Patient's presentation is most consistent with acute illness / injury with system symptoms.  41 yo M here with dental pain.Exam is consistent with likely reversible pulpitis/mild gingivitis or periapical abscess. No signs of sepsis. No evidence of sublingual or submandibular extension or involvement. Airway is intact. He is not diabetic or immunosuppressed. Will place on empiric abx and refer to dentistry.   FINAL CLINICAL IMPRESSION(S) / ED DIAGNOSES   Final diagnoses:  Pain, dental     Rx / DC Orders   ED Discharge Orders          Ordered    amoxicillin-clavulanate (AUGMENTIN) 875-125 MG tablet  2 times daily  03/26/22 1255             Note:  This document was prepared using Dragon voice recognition software and may include unintentional dictation errors.   Shaune Pollack, MD 03/26/22 2250

## 2022-06-12 DIAGNOSIS — J0140 Acute pansinusitis, unspecified: Secondary | ICD-10-CM | POA: Diagnosis not present

## 2022-08-14 ENCOUNTER — Emergency Department
Admission: EM | Admit: 2022-08-14 | Discharge: 2022-08-14 | Disposition: A | Discharge status: Home / Self Care | Payer: BC Managed Care – PPO | Attending: Emergency Medicine | Admitting: Emergency Medicine

## 2022-08-14 ENCOUNTER — Other Ambulatory Visit: Payer: Self-pay

## 2022-08-14 DIAGNOSIS — K0889 Other specified disorders of teeth and supporting structures: Secondary | ICD-10-CM | POA: Diagnosis not present

## 2022-08-14 DIAGNOSIS — K047 Periapical abscess without sinus: Secondary | ICD-10-CM | POA: Diagnosis not present

## 2022-08-14 MED ORDER — AMOXICILLIN 500 MG PO CAPS: 500.0000 mg | ORAL_CAPSULE | Freq: Three times a day (TID) | ORAL | 0 refills | Status: DC

## 2022-08-14 MED ORDER — AMOXICILLIN 500 MG PO CAPS
500.0000 mg | ORAL_CAPSULE | Freq: Once | ORAL | Status: AC
  Administered 2022-08-14: 500 mg via ORAL
  Filled 2022-08-14: qty 1

## 2022-08-14 NOTE — ED Triage Notes (Signed)
Pt reports breaking tooth last night while eating. (Bottom left). Pt reports swelling. Mild redness/swelling noted around broken tooth in bottom left part of mouth. Pt reports taking tylenol prior to leaving house and rates pain a 2/10.

## 2022-08-14 NOTE — ED Provider Notes (Signed)
[facility] Provider Note    Event Date/Time   First MD Initiated Contact with Patient 08/14/22 2134     (approximate)   History   Dental Injury   HPI  Douglas Leonard is a 42 y.o. male with no significant past medical history and as listed in EMR presents to the emergency department for treatment and evaluation of dental pain due to a broken tooth. Pain and swelling started today.  No fever.  He is scheduled appointment with his dentist for this coming Friday.      Physical Exam   Triage Vital Signs: ED Triage Vitals  Enc Vitals Group     BP 08/14/22 2107 (!) 146/101     Pulse Rate 08/14/22 2107 76     Resp 08/14/22 2107 15     Temp 08/14/22 2107 98.2 F (36.8 C)     Temp Source 08/14/22 2107 Oral     SpO2 08/14/22 2107 97 %     Weight 08/14/22 2109 200 lb (90.7 kg)     Height 08/14/22 2109 6' (1.829 m)     Head Circumference --      Peak Flow --      Pain Score 08/14/22 2109 2     Pain Loc --      Pain Edu? --      Excl. in GC? --     Most recent vital signs: Vitals:   08/14/22 2107  BP: (!) 146/101  Pulse: 76  Resp: 15  Temp: 98.2 F (36.8 C)  SpO2: 97%    General: Awake, no distress.  CV:  Good peripheral perfusion.  Resp:  Normal effort.  Abd:  No distention.  Other:  Bottom left molar broken down to gumline.  Associated facial swelling just below tooth.  Sublingual surface is soft.   ED Results / Procedures / Treatments   Labs (all labs ordered are listed, but only abnormal results are displayed) Labs Reviewed - No data to display   EKG  Not indicated.   RADIOLOGY  Image and radiology report reviewed and interpreted by me. Radiology report consistent with the same.  Not indicated.  PROCEDURES:  Critical Care performed: No  Procedures   MEDICATIONS ORDERED IN ED:  Medications  amoxicillin (AMOXIL) capsule 500 mg (has no administration in time range)     IMPRESSION / MDM / ASSESSMENT AND PLAN / ED  COURSE   I have reviewed the triage note.  Differential diagnosis includes, but is not limited to, dental pain, dental abscess  Patient's presentation is most consistent with acute, uncomplicated illness.  42 year old male presenting to the emergency department for treatment and evaluation of dental pain and swelling.  See HPI for further details.  Plan will be to start amoxicillin and have him keep his upcoming dental appointment.  If symptoms change or worsen and he is unable to be seen sooner, he is to return to the emergency department.    FINAL CLINICAL IMPRESSION(S) / ED DIAGNOSES   Final diagnoses:  Dental abscess     Rx / DC Orders   ED Discharge Orders          Ordered    amoxicillin (AMOXIL) 500 MG capsule  3 times daily        08/14/22 2228             Note:  This document was prepared using Dragon voice recognition software and may include unintentional dictation errors.   Cristin Penaflor B,  FNP 08/14/22 2229    Sharman Cheek, MD 08/17/22 [phone]

## 2022-08-14 NOTE — Discharge Instructions (Signed)
Please call and schedule a dental appointment as soon as possible. You will need to be seen within the next 14 days. Return to the emergency department for symptoms that change or worsen if you're unable to schedule an appointment. ° °

## 2022-08-17 ENCOUNTER — Ambulatory Visit (INDEPENDENT_AMBULATORY_CARE_PROVIDER_SITE_OTHER): Payer: BC Managed Care – PPO | Admitting: Family Medicine

## 2022-08-17 ENCOUNTER — Encounter: Payer: Self-pay | Admitting: Family Medicine

## 2022-08-17 VITALS — BP 120/78 | HR 82 | Ht 72.0 in | Wt 203.0 lb

## 2022-08-17 DIAGNOSIS — F419 Anxiety disorder, unspecified: Secondary | ICD-10-CM

## 2022-08-17 DIAGNOSIS — R002 Palpitations: Secondary | ICD-10-CM

## 2022-08-17 DIAGNOSIS — F32A Depression, unspecified: Secondary | ICD-10-CM | POA: Diagnosis not present

## 2022-08-17 DIAGNOSIS — R0602 Shortness of breath: Secondary | ICD-10-CM

## 2022-08-17 NOTE — Progress Notes (Signed)
Date:  08/17/2022   Name:  Douglas Leonard   DOB:  1981/04/17   MRN:  [id number]   Chief Complaint: ADHD (Refer to psych 15 and 17) and anger  Patient is a 42 year old male who presents for a establish care exam. The patient reports the following problems: palpitations. Health maintenance has been reviewed up to date,    Palpitations  This is a new (since covid) problem. The current episode started more than 1 year ago. The problem occurs intermittently. The problem has been gradually improving. Associated symptoms include chest fullness, coughing, diaphoresis and dizziness. Pertinent negatives include no chest pain or shortness of breath. The treatment provided no relief.    Lab Results  Component Value Date   NA 139 11/05/2021   K 3.8 11/05/2021   CO2 26 11/05/2021   GLUCOSE 135 (H) 11/05/2021   BUN 14 11/05/2021   CREATININE 1.11 11/05/2021   CALCIUM 8.7 (L) 11/05/2021   GFRNONAA >60 11/05/2021   No results found for: "CHOL", "HDL", "LDLCALC", "LDLDIRECT", "TRIG", "CHOLHDL" No results found for: "TSH" No results found for: "HGBA1C" Lab Results  Component Value Date   WBC 7.7 11/05/2021   HGB 15.1 11/05/2021   HCT 43.3 11/05/2021   MCV 87.7 11/05/2021   PLT 255 11/05/2021   No results found for: "ALT", "AST", "GGT", "ALKPHOS", "BILITOT" No results found for: "25OHVITD2", "25OHVITD3", "VD25OH"   Review of Systems  Constitutional:  Positive for diaphoresis.  Respiratory:  Positive for cough and wheezing. Negative for chest tightness and shortness of breath.   Cardiovascular:  Positive for palpitations. Negative for chest pain and leg swelling.  Neurological:  Positive for dizziness.    There are no problems to display for this patient.   Allergies  Allergen Reactions   Oxycodone    Percocet [Oxycodone-Acetaminophen]    Valium [Diazepam]    Vicodin [Hydrocodone-Acetaminophen]     Past Surgical History:  Procedure Laterality Date   SHOULDER SURGERY       Social History   Tobacco Use   Smoking status: Never   Smokeless tobacco: Never  Substance Use Topics   Alcohol use: No    Comment: Occ   Drug use: No     Medication list has been reviewed and updated.  Current Meds  Medication Sig   amoxicillin (AMOXIL) 500 MG capsule Take 1 capsule (500 mg total) by mouth 3 (three) times daily.   fluticasone (FLONASE) 50 MCG/ACT nasal spray Place into both nostrils daily.       08/17/2022    1:28 PM  GAD 7 : Generalized Anxiety Score  Nervous, Anxious, on Edge 1  Control/stop worrying 3  Worry too much - different things 3  Trouble relaxing 2  Restless 2  Easily annoyed or irritable 3  Afraid - awful might happen 3  Total GAD 7 Score 17  Anxiety Difficulty Not difficult at all       08/17/2022    1:27 PM  Depression screen PHQ 2/9  Decreased Interest 2  Down, Depressed, Hopeless 1  PHQ - 2 Score 3  Altered sleeping 3  Tired, decreased energy 3  Change in appetite 2  Feeling bad or failure about yourself  0  Trouble concentrating 3  Moving slowly or fidgety/restless 1  Suicidal thoughts 0  PHQ-9 Score 15  Difficult doing work/chores Not difficult at all    BP Readings from Last 3 Encounters:  08/17/22 120/78  08/14/22 (!) 144/98  03/26/22 (!) 127/95  Physical Exam Vitals and nursing note reviewed.  HENT:     Head: Normocephalic.     Right Ear: Tympanic membrane and external ear normal.     Left Ear: Tympanic membrane and external ear normal.     Nose: Nose normal. No congestion or rhinorrhea.     Mouth/Throat:     Mouth: Mucous membranes are moist.  Eyes:     General: No scleral icterus.       Right eye: No discharge.        Left eye: No discharge.     Conjunctiva/sclera: Conjunctivae normal.     Pupils: Pupils are equal, round, and reactive to light.  Neck:     Thyroid: No thyromegaly.     Vascular: No JVD.     Trachea: No tracheal deviation.  Cardiovascular:     Rate and Rhythm: Normal rate and  regular rhythm.     Heart sounds: Normal heart sounds. No murmur heard.    No friction rub. No gallop.  Pulmonary:     Effort: No respiratory distress.     Breath sounds: Normal breath sounds. No wheezing, rhonchi or rales.  Chest:     Chest wall: No tenderness.  Abdominal:     General: Bowel sounds are normal.     Palpations: Abdomen is soft. There is no mass.     Tenderness: There is no abdominal tenderness. There is no guarding or rebound.  Musculoskeletal:        General: No tenderness. Normal range of motion.     Cervical back: Normal range of motion and neck supple.  Lymphadenopathy:     Cervical: No cervical adenopathy.  Skin:    General: Skin is warm.     Findings: No rash.  Neurological:     Mental Status: He is alert and oriented to person, place, and time.     Cranial Nerves: No cranial nerve deficit.     Deep Tendon Reflexes: Reflexes are normal and symmetric.     Wt Readings from Last 3 Encounters:  08/17/22 203 lb (92.1 kg)  08/14/22 200 lb (90.7 kg)  03/26/22 200 lb (90.7 kg)    BP 120/78   Pulse 82   Ht 6' (1.829 m)   Wt 203 lb (92.1 kg)   SpO2 97%   BMI 27.53 kg/m   Assessment and Plan: 1. Anxiety and depression Chronic.  Persistent.  Episodic.  PHQ is 15 GAD score 17.  Patient has been on venlafaxine in the past and did not tolerate.  We will refer to psychiatry for reevaluation and discussion on next step therapy.  In the meantime patient also has related that he has as a child had ADHD and was treated but I have discussed this with patient as of this would need to also be evaluated by psychiatry but we would be hesitant given the anger issues that he has had using amphetamine in the future. - Ambulatory referral to Psychiatry  2. Palpitations Chronic.  Episode yesterday similar to what he went to the hospital for in July 2023 for bronchitis with cough, shortness of breath, and palpitations.  Palpitations lasted for couple minutes and patient went to  a quieter room and was able to decompress and to stabilize.  Other than some mild discomfort patient has had no further episodes of pain but this is all been since he had COVID and the concern is whether or not he might have a post-COVID cardiac circumstance.  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  I feel that given that the patient is having on and off circumstances since his COVID exposure warranted a closer evaluation to rule out a cardiomyopathy.  Patient does relate that he has a family history of a heart transplant in his family for unknown reason. - EKG 12-Lead - Ambulatory referral to Cardiology  3. Shortness of breath Patient has shortness of breath but only after and during the palpitations.  There may be an anxiety component I did review the chest x-ray when he had a similar episode in July 2023 which was read as normal given the auscultation and percussion is normal we will hold on the chest x-ray but patient has been encouraged to go to the emergency room if further episodes should occur. - EKG 12-Lead - Ambulatory referral to Cardiology     Elizabeth Sauer, MD

## 2022-08-17 NOTE — Addendum Note (Signed)
Addended by: Everitt Amber on: 08/17/2022 04:14 PM   Modules accepted: Level of Service

## 2022-08-22 ENCOUNTER — Ambulatory Visit (INDEPENDENT_AMBULATORY_CARE_PROVIDER_SITE_OTHER): Payer: BC Managed Care – PPO

## 2022-08-22 ENCOUNTER — Ambulatory Visit: Payer: BC Managed Care – PPO | Attending: Cardiology | Admitting: Cardiology

## 2022-08-22 ENCOUNTER — Encounter: Payer: Self-pay | Admitting: Cardiology

## 2022-08-22 VITALS — BP 120/80 | HR 73 | Ht 72.0 in | Wt 208.4 lb

## 2022-08-22 DIAGNOSIS — R002 Palpitations: Secondary | ICD-10-CM

## 2022-08-22 DIAGNOSIS — R0602 Shortness of breath: Secondary | ICD-10-CM

## 2022-08-22 NOTE — Patient Instructions (Signed)
Medication Instructions:   Your physician recommends that you continue on your current medications as directed. Please refer to the Current Medication list given to you today.  *If you need a refill on your cardiac medications before your next appointment, please call your pharmacy*   Lab Work:  None Ordered  If you have labs (blood work) drawn today and your tests are completely normal, you will receive your results only by: MyChart Message (if you have MyChart) OR A paper copy in the mail If you have any lab test that is abnormal or we need to change your treatment, we will call you to review the results.   Testing/Procedures:  Your physician has requested that you have an echocardiogram. Echocardiography is a painless test that uses sound waves to create images of your heart. It provides your doctor with information about the size and shape of your heart and how well your heart's chambers and valves are working. This procedure takes approximately one hour. There are no restrictions for this procedure. Please do NOT wear cologne, perfume, aftershave, or lotions (deodorant is allowed). Please arrive 15 minutes prior to your appointment time.  2. Your physician has recommended that you wear a Zio monitor.   This monitor is a medical device that records the heart's electrical activity. Doctors most often use these monitors to diagnose arrhythmias. Arrhythmias are problems with the speed or rhythm of the heartbeat. The monitor is a small device applied to your chest. You can wear one while you do your normal daily activities. While wearing this monitor if you have any symptoms to push the button and record what you felt. Once you have worn this monitor for the period of time provider prescribed (Usually 14 days), you will return the monitor device in the postage paid box. Once it is returned they will download the data collected and provide us with a report which the provider will then review  and we will call you with those results. Important tips:  Avoid showering during the first 24 hours of wearing the monitor. Avoid excessive sweating to help maximize wear time. Do not submerge the device, no hot tubs, and no swimming pools. Keep any lotions or oils away from the patch. After 24 hours you may shower with the patch on. Take brief showers with your back facing the shower head.  Do not remove patch once it has been placed because that will interrupt data and decrease adhesive wear time. Push the button when you have any symptoms and write down what you were feeling. Once you have completed wearing your monitor, remove and place into box which has postage paid and place in your outgoing mailbox.  If for some reason you have misplaced your box then call our office and we can provide another box and/or mail it off for you.     Follow-Up: At [facility], you and your health needs are our priority.  As part of our continuing mission to provide you with exceptional heart care, we have created designated Provider Care Teams.  These Care Teams include your primary Cardiologist (physician) and Advanced Practice Providers (APPs -  Physician Assistants and Nurse Practitioners) who all work together to provide you with the care you need, when you need it.  We recommend signing up for the patient portal called "MyChart".  Sign up information is provided on this After Visit Summary.  MyChart is used to connect with patients for Virtual Visits (Telemedicine).  Patients are able to   view lab/test results, encounter notes, upcoming appointments, etc.  Non-urgent messages can be sent to your provider as well.   To learn more about what you can do with MyChart, go to [email or web address].    Your next appointment:   2 month(s)  Provider:   You may see Debbe Odea, MD or one of the following Advanced Practice Providers on your designated Care Team:   Nicolasa Ducking, NP Eula Listen, PA-C Cadence Fransico Reynold, PA-C Charlsie Quest, NP

## 2022-08-22 NOTE — Progress Notes (Signed)
**Note Douglas-Identified via Obfuscation** Cardiology Office Note:    Date:  08/22/2022   ID:  Douglas Leonard, DOB 02-26-1981, MRN [id number]  PCP:  Duanne Limerick, MD   [facility] Providers Cardiologist:  Debbe Odea, MD     Referring MD: Duanne Limerick, MD   Chief Complaint  Patient presents with   New Patient (Initial Visit)    Elevated BP/Palpitations/SOB. Meds reviewed verbally with pt.   Douglas Leonard is a 42 y.o. male who is being seen today for the evaluation of palpitations at the request of Duanne Limerick, MD.   History of Present Illness:    Douglas Leonard is a 42 y.o. male with history of anxiety/depression, who presents with palpitations and shortness of breath.  Patient had COVID-19 infection back in December 2021 to January 2022.  Since then, he has noticed palpitations, shortness of breath and diaphoresis happening randomly with no exertion.  He has had about 2 or 3 episodes of persistent palpitations lasting several minutes causing diaphoresis.  Over the past several weeks, he has noticed irregular heartbeats, occurring almost daily, lasting a few seconds.  His father has a history of cardiomyopathy, underwent heart transplant, died from a stroke in his 54s.  Past Medical History:  Diagnosis Date   Back pain     Past Surgical History:  Procedure Laterality Date   SHOULDER SURGERY      Current Medications: No outpatient medications have been marked as taking for the 08/22/22 encounter (Office Visit) with Debbe Odea, MD.     Allergies:   Oxycodone, Percocet [oxycodone-acetaminophen], Valium [diazepam], and Vicodin [hydrocodone-acetaminophen]   Social History   Socioeconomic History   Marital status: Married    Spouse name: Not on file   Number of children: Not on file   Years of education: Not on file   Highest education level: Not on file  Occupational History   Not on file  Tobacco Use   Smoking status: Never   Smokeless tobacco: Never  Vaping Use   Vaping  Use: Never used  Substance and Sexual Activity   Alcohol use: No    Comment: Occ   Drug use: No   Sexual activity: Yes  Other Topics Concern   Not on file  Social History Narrative   Not on file   Social Determinants of Health   Financial Resource Strain: Not on file  Food Insecurity: Not on file  Transportation Needs: Not on file  Physical Activity: Not on file  Stress: Not on file  Social Connections: Not on file     Family History: The patient's family history includes Heart failure in his father; Stroke in his father.  ROS:   Please see the history of present illness.     All other systems reviewed and are negative.  EKGs/Labs/Other Studies Reviewed:    The following studies were reviewed today:   EKG:  EKG is  ordered today.  The ekg ordered today demonstrates normal sinus rhythm, normal ECG.  Recent Labs: 11/05/2021: BUN 14; Creatinine, Ser 1.11; Hemoglobin 15.1; Platelets 255; Potassium 3.8; Sodium 139  Recent Lipid Panel No results found for: "CHOL", "TRIG", "HDL", "CHOLHDL", "VLDL", "LDLCALC", "LDLDIRECT"   Risk Assessment/Calculations:             Physical Exam:    VS:  BP 120/80 (BP Location: Right Arm, Patient Position: Sitting, Cuff Size: Normal)   Pulse 73   Ht 6' (1.829 m)   Wt 208 lb 6 oz (94.5 kg)   SpO2  98%   BMI 28.26 kg/m     Wt Readings from Last 3 Encounters:  08/22/22 208 lb 6 oz (94.5 kg)  08/17/22 203 lb (92.1 kg)  08/14/22 200 lb (90.7 kg)     GEN:  Well nourished, well developed in no acute distress HEENT: Normal NECK: No JVD; No carotid bruits CARDIAC: RRR, no murmurs, rubs, gallops RESPIRATORY:  Clear to auscultation without rales, wheezing or rhonchi  ABDOMEN: Soft, non-tender, non-distended MUSCULOSKELETAL:  No edema; No deformity  SKIN: Warm and dry NEUROLOGIC:  Alert and oriented x 3 PSYCHIATRIC:  Normal affect   ASSESSMENT:    1. Palpitations   2. Shortness of breath    PLAN:    In order of problems  listed above:  Palpitations, presenting after COVID-19 infection, place cardiac monitor to evaluate any significant arrhythmias. Shortness of breath, palpitations.  Obtain echocardiogram.  Follow-up in 2 months.      Medication Adjustments/Labs and Tests Ordered: Current medicines are reviewed at length with the patient today.  Concerns regarding medicines are outlined above.  Orders Placed This Encounter  Procedures   LONG TERM MONITOR (3-14 DAYS)   EKG 12-Lead   ECHOCARDIOGRAM COMPLETE   No orders of the defined types were placed in this encounter.   Patient Instructions  Medication Instructions:   Your physician recommends that you continue on your current medications as directed. Please refer to the Current Medication list given to you today.  *If you need a refill on your cardiac medications before your next appointment, please call your pharmacy*   Lab Work:  None Ordered  If you have labs (blood work) drawn today and your tests are completely normal, you will receive your results only by: MyChart Message (if you have MyChart) OR A paper copy in the mail If you have any lab test that is abnormal or we need to change your treatment, we will call you to review the results.   Testing/Procedures:  Your physician has requested that you have an echocardiogram. Echocardiography is a painless test that uses sound waves to create images of your heart. It provides your doctor with information about the size and shape of your heart and how well your heart's chambers and valves are working. This procedure takes approximately one hour. There are no restrictions for this procedure. Please do NOT wear cologne, perfume, aftershave, or lotions (deodorant is allowed). Please arrive 15 minutes prior to your appointment time.  2. Your physician has recommended that you wear a Zio monitor.   This monitor is a medical device that records the heart's electrical activity. Doctors most  often use these monitors to diagnose arrhythmias. Arrhythmias are problems with the speed or rhythm of the heartbeat. The monitor is a small device applied to your chest. You can wear one while you do your normal daily activities. While wearing this monitor if you have any symptoms to push the button and record what you felt. Once you have worn this monitor for the period of time provider prescribed (Usually 14 days), you will return the monitor device in the postage paid box. Once it is returned they will download the data collected and provide [place] with a report which the provider will then review and we will call you with those results. Important tips:  Avoid showering during the first 24 hours of wearing the monitor. Avoid excessive sweating to help maximize wear time. Do not submerge the device, no hot tubs, and no swimming pools. Keep any lotions or  oils away from the patch. After 24 hours you may shower with the patch on. Take brief showers with your back facing the shower head.  Do not remove patch once it has been placed because that will interrupt data and decrease adhesive wear time. Push the button when you have any symptoms and write down what you were feeling. Once you have completed wearing your monitor, remove and place into box which has postage paid and place in your outgoing mailbox.  If for some reason you have misplaced your box then call our office and we can provide another box and/or mail it off for you.     Follow-Up: At [facility], you and your health needs are our priority.  As part of our continuing mission to provide you with exceptional heart care, we have created designated Provider Care Teams.  These Care Teams include your primary Cardiologist (physician) and Advanced Practice Providers (APPs -  Physician Assistants and Nurse Practitioners) who all work together to provide you with the care you need, when you need it.  We recommend signing up for the  patient portal called "MyChart".  Sign up information is provided on this After Visit Summary.  MyChart is used to connect with patients for Virtual Visits (Telemedicine).  Patients are able to view lab/test results, encounter notes, upcoming appointments, etc.  Non-urgent messages can be sent to your provider as well.   To learn more about what you can do with MyChart, go to [email or web address].    Your next appointment:   2 month(s)  Provider:   You may see Debbe Odea, MD or one of the following Advanced Practice Providers on your designated Care Team:   Nicolasa Ducking, NP Eula Listen, PA-C Cadence Fransico Keldrick, PA-C Charlsie Quest, NP    Signed, Debbe Odea, MD  08/22/2022 9:32 AM    [facility]

## 2022-08-26 DIAGNOSIS — R002 Palpitations: Secondary | ICD-10-CM

## 2022-08-28 ENCOUNTER — Ambulatory Visit: Payer: Self-pay | Admitting: *Deleted

## 2022-08-28 ENCOUNTER — Telehealth: Payer: Self-pay | Admitting: Cardiology

## 2022-08-28 NOTE — Telephone Encounter (Signed)
Pt would like a callback regarding his zio monitior falling off. Pt states he sweats at work and it fell off once he got home and now has a rash. Please advise

## 2022-08-28 NOTE — Telephone Encounter (Signed)
Spoke with patient and he stated when he spoke to [facility] customer service they requested that he send the monitor back as it won't adhere to his skin. Informed patient to apply some hydrocortisone cream to the rash area to see if it goes away. Also instructed patient to keep appointment for echocardiogram and that the provider would follow-up from there. Patient was agreeable with new plan.

## 2022-08-28 NOTE — Telephone Encounter (Signed)
  Chief Complaint: Rash Symptoms: Rash from cardiac halter monitor. Zio. States sweats excessively and when home from work today, monitor was off and red raised rash at area by collar bone and further down on chest. States monitor is no longer on, will not adhere.  Frequency: Today Pertinent Negatives: Patient denies  Disposition: [] ED /[] Urgent Care (no appt availability in office) / [] Appointment(In office/virtual)/ []  [facility]/ [] Home Care/ [] Refused Recommended Disposition /[] [facility]/ []  Follow-up with PCP Additional Notes: Advised to alert cardiologist and Zio. States has called ZIO, will alert cardiologist. Advised to call back if needed. Assured pt NT would route to practice for PCPs review as well.  Reason for Disposition  Medication patch causing local rash or itching    Zio monitor causing rash  Answer Assessment - Initial Assessment Questions 1. APPEARANCE of RASH: "Describe the rash."      Red raised by collar bone 2. LOCATION: "Where is the rash located?"      Collar bone and over chest area 3. NUMBER: "How many spots are there?"       4. SIZE: "How big are the spots?" (Inches, centimeters or compare to size of a coin)       5. ONSET: "When did the rash start?"      Today 6. ITCHING: "Does the rash itch?" If Yes, ask: "How bad is the itch?"  (Scale 0-10; or none, mild, moderate, severe)     No 7. PAIN: "Does the rash hurt?" If Yes, ask: "How bad is the pain?"  (Scale 0-10; or none, mild, moderate, severe)    - NONE (0): no pain    - MILD (1-3): doesn't interfere with normal activities     - MODERATE (4-7): interferes with normal activities or awakens from sleep     - SEVERE (8-10): excruciating pain, unable to do any normal activities     Irritated, tender 8. OTHER SYMPTOMS: "Do you have any other symptoms?" (e.g., fever)     No  Protocols used: Rash or Redness - Localized-A-AH

## 2022-08-29 ENCOUNTER — Ambulatory Visit: Payer: Self-pay | Admitting: Physician Assistant

## 2022-09-04 NOTE — Telephone Encounter (Signed)
Incoming email from [employer],  This email is to notify you that the patient listed below had an early fall-off issue where the patch fell off in the first 48 hours of wear. We have placed an order for a replacement device to be shipped to the patient and placed a hold on the billing, so the patient is not charged for the first device. If you have any questions, please respond to this email, or call us at [phone].   Patient initials: M.F. Order ID: [id number]  Patient ID: [id number]  SN: [id number]  Ticket: [id number]   Account: [facility] [place]   Thank you,  [facility] Customer Care

## 2022-09-20 ENCOUNTER — Ambulatory Visit: Payer: BC Managed Care – PPO | Attending: Cardiology

## 2022-09-20 DIAGNOSIS — R002 Palpitations: Secondary | ICD-10-CM

## 2022-09-20 DIAGNOSIS — R0602 Shortness of breath: Secondary | ICD-10-CM

## 2022-09-20 LAB — ECHOCARDIOGRAM COMPLETE
AR max vel: 3.64 cm2
AV Area VTI: 3.63 cm2
AV Area mean vel: 3.57 cm2
AV Mean grad: 1 mmHg
AV Peak grad: 2.6 mmHg
Ao pk vel: 0.8 m/s
Area-P 1/2: 2.87 cm2
P 1/2 time: 523 msec
S' Lateral: 3.2 cm

## 2022-10-23 ENCOUNTER — Encounter: Payer: Self-pay | Admitting: Cardiology

## 2022-10-23 ENCOUNTER — Ambulatory Visit: Payer: BC Managed Care – PPO | Attending: Cardiology | Admitting: Cardiology

## 2022-11-02 DIAGNOSIS — H18823 Corneal disorder due to contact lens, bilateral: Secondary | ICD-10-CM | POA: Diagnosis not present

## 2022-11-02 DIAGNOSIS — H5712 Ocular pain, left eye: Secondary | ICD-10-CM | POA: Diagnosis not present

## 2024-05-26 ENCOUNTER — Emergency Department

## 2024-05-26 ENCOUNTER — Emergency Department: Admission: EM | Admit: 2024-05-26 | Discharge: 2024-05-26 | Disposition: A | Discharge status: Home / Self Care

## 2024-05-26 ENCOUNTER — Encounter: Payer: Self-pay | Admitting: Emergency Medicine

## 2024-05-26 ENCOUNTER — Other Ambulatory Visit: Payer: Self-pay

## 2024-05-26 DIAGNOSIS — R0602 Shortness of breath: Secondary | ICD-10-CM | POA: Insufficient documentation

## 2024-05-26 DIAGNOSIS — R079 Chest pain, unspecified: Secondary | ICD-10-CM | POA: Insufficient documentation

## 2024-05-26 LAB — BASIC METABOLIC PANEL WITH GFR
Anion gap: 11 (ref 5–15)
BUN: 17 mg/dL (ref 6–20)
CO2: 24 mmol/L (ref 22–32)
Calcium: 9.3 mg/dL (ref 8.9–10.3)
Chloride: 103 mmol/L (ref 98–111)
Creatinine, Ser: 0.94 mg/dL (ref 0.61–1.24)
GFR, Estimated: >60 mL/min
Glucose, Bld: 88 mg/dL (ref 70–99)
Potassium: 3.9 mmol/L (ref 3.5–5.1)
Sodium: 138 mmol/L (ref 135–145)

## 2024-05-26 LAB — TROPONIN T, HIGH SENSITIVITY: Troponin T High Sensitivity: 7 ng/L (ref 0–19)

## 2024-05-26 LAB — CBC
HCT: 43.2 % (ref 39.0–52.0)
Hemoglobin: 15.3 g/dL (ref 13.0–17.0)
MCH: 31 pg (ref 26.0–34.0)
MCHC: 35.4 g/dL (ref 30.0–36.0)
MCV: 87.6 fL (ref 80.0–100.0)
Platelets: 247 10*3/uL (ref 150–400)
RBC: 4.93 MIL/uL (ref 4.22–5.81)
RDW: 11.9 % (ref 11.5–15.5)
WBC: 7.7 10*3/uL (ref 4.0–10.5)
nRBC: 0 % (ref 0.0–0.2)

## 2024-05-26 LAB — D-DIMER, QUANTITATIVE: D-Dimer, Quant: <0.27 ug{FEU}/mL (ref 0.00–0.50)

## 2024-05-26 NOTE — ED Triage Notes (Signed)
 Pt with mid epigastric non radiating chest pain, worse with palpation. Began this morning while working. Has been followed by cardiology in the past.  No n/v/d or shob.

## 2024-05-26 NOTE — Discharge Instructions (Signed)
 You were seen today due to concern of chest pain, at this time fortunately your labs are reassuring I would recommend following up with a cardiologist.  If you have any worsening of symptoms such as increased chest pain, shortness of breath, lightheadedness, or any other symptoms you find concerning please return to the emergency department immediately for further medical management.

## 2024-05-26 NOTE — ED Notes (Signed)
 Pt given DC instructions. Pt verbalized understanding of follow up care. Pt ambulatory from ED without difficulty.

## 2024-05-28 NOTE — Progress Notes (Unsigned)
 "  Cardiology Clinic Note   Date: 05/28/2024 ID: Douglas Leonard, DOB [birth year], MRN [id number]  Primary Cardiologist:  Redell Cave, MD  Chief Complaint   Douglas Leonard is a 44 y.o. male who presents to the clinic today for ***  Patient Profile   Douglas Leonard is followed by Dr. Cave for the history outlined below.      Past medical history significant for: Palpitations. 2-day ZIO 10/11/2022: HR 47 to 169 bpm, average 88 bpm.  Predominantly sinus rhythm.  BBB/IVCD present.  No significant arrhythmias.  Rare ectopy.  Patient triggered events associated with sinus rhythm. Dyspnea. Echo 09/20/2022: EF 55 to 60%.  No RWMA.  Normal diastolic parameters.  Normal RV size/function.  Mild AI.  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  In summary, patient was first evaluated by Dr. Cave on 08/22/2022 for palpitations and dyspnea.  Patient reported having COVID from December 2021 to January 2022.  Following that he noted palpitations, shortness of breath, and diaphoresis occurring randomly with no exertion.  He reported 2-3 episodes of persistent palpitations lasting several minutes causing diaphoresis.  He reported a several week history of irregular heartbeats occurring almost daily lasting a few seconds.  He reported a family history of cardiomyopathy with his father undergoing heart transplant and died from a stroke in his 14s.  Patient wore 2-day ZIO which demonstrated HR 47 to 169 bpm, average 88 bpm.  Echo demonstrated normal LV/RV function with mild AI and moderate dilatation of aortic root.  Patient presented to the ED on 05/26/2024 for midepigastric chest pain worse with palpation.  He reported pain worsened with exertion but persisted even while at rest.  EKG without acute changes.  Labs were unremarkable with negative D-dimer and negative troponin.  Chest x-ray with no acute cardiopulmonary abnormalities.  He was discharged to follow-up with cardiology as an outpatient.      History of Present Illness    Today, patient ***  Chest pain Patient with recent ED visit for mid epigastric chest pain.  ED workup was unremarkable with negative D-dimer and negative troponin.  Patient*** -***  Palpitations 2-day ZIO June 2024 demonstrated HR 47 to 169 bpm, average 88 bpm, BBB/IVCD present, rare ectopy, no significant arrhythmias, patient triggered events associated with sinus rhythm.  Patient*** -***  Dyspnea Echo May 2024 demonstrated normal LV/RV function, normal diastolic parameters, mild AI, moderate dilatation of aortic root 47 mm.  Patient*** -***  ROS: All other systems reviewed and are otherwise negative except as noted in History of Present Illness.  EKGs/Labs Reviewed        05/26/2024: BUN 17; Creatinine, Ser 0.94; Potassium 3.9; Sodium 138   05/26/2024: Hemoglobin 15.3; WBC 7.7   No results found for requested labs within last 365 days.   No results found for requested labs within last 365 days.  ***  Risk Assessment/Calculations    {Does this patient have ATRIAL FIBRILLATION?[phone]} No BP recorded.  {Refresh Note OR Click here to enter BP  :1}***        Physical Exam    VS:  There were no vitals taken for this visit. , BMI There is no height or weight on file to calculate BMI.  GEN: Well nourished, well developed, in no acute distress. Neck: No JVD or carotid bruits. Cardiac: *** RRR. *** No murmur. No rubs or gallops.   Respiratory:  Respirations regular and unlabored. Clear to auscultation without rales, wheezing or rhonchi. GI: Soft, nontender, nondistended. Extremities: Radials/DP/PT 2+ and  equal bilaterally. No clubbing or cyanosis. No edema ***  Skin: Warm and dry, no rash. Neuro: Strength intact.  Assessment & Plan   ***  Disposition: ***     {Are you ordering a CV Procedure (e.g. stress test, cath, DCCV, TEE, etc)?   Press F2        :[id number]}   Signed, Barnie HERO. Crespin Forstrom, DNP, NP-C  "

## 2024-06-02 ENCOUNTER — Ambulatory Visit: Admitting: Student
